# Patient Record
Sex: Male | Born: 1935 | Race: White | Hispanic: No | Marital: Married | Smoking: Former smoker
Health system: Southern US, Community
[De-identification: ages and names within clinical notes are randomized; demographics above are authoritative.]

## PROBLEM LIST (undated history)

## (undated) DIAGNOSIS — Z8669 Personal history of other diseases of the nervous system and sense organs: Secondary | ICD-10-CM

## (undated) DIAGNOSIS — K5792 Diverticulitis of intestine, part unspecified, without perforation or abscess without bleeding: Secondary | ICD-10-CM

## (undated) DIAGNOSIS — R7303 Prediabetes: Secondary | ICD-10-CM

## (undated) DIAGNOSIS — T8859XA Other complications of anesthesia, initial encounter: Secondary | ICD-10-CM

## (undated) DIAGNOSIS — I10 Essential (primary) hypertension: Secondary | ICD-10-CM

## (undated) DIAGNOSIS — M1990 Unspecified osteoarthritis, unspecified site: Secondary | ICD-10-CM

## (undated) DIAGNOSIS — K649 Unspecified hemorrhoids: Secondary | ICD-10-CM

## (undated) DIAGNOSIS — Z9889 Other specified postprocedural states: Secondary | ICD-10-CM

## (undated) DIAGNOSIS — E785 Hyperlipidemia, unspecified: Secondary | ICD-10-CM

## (undated) DIAGNOSIS — I219 Acute myocardial infarction, unspecified: Secondary | ICD-10-CM

## (undated) DIAGNOSIS — C801 Malignant (primary) neoplasm, unspecified: Secondary | ICD-10-CM

## (undated) DIAGNOSIS — T4145XA Adverse effect of unspecified anesthetic, initial encounter: Secondary | ICD-10-CM

## (undated) DIAGNOSIS — H269 Unspecified cataract: Secondary | ICD-10-CM

## (undated) DIAGNOSIS — R194 Change in bowel habit: Secondary | ICD-10-CM

## (undated) DIAGNOSIS — K219 Gastro-esophageal reflux disease without esophagitis: Secondary | ICD-10-CM

## (undated) DIAGNOSIS — T7840XA Allergy, unspecified, initial encounter: Secondary | ICD-10-CM

## (undated) DIAGNOSIS — K5900 Constipation, unspecified: Secondary | ICD-10-CM

## (undated) HISTORY — DX: Unspecified cataract: H26.9

## (undated) HISTORY — DX: Unspecified hemorrhoids: K64.9

## (undated) HISTORY — DX: Allergy, unspecified, initial encounter: T78.40XA

## (undated) HISTORY — PX: OTHER SURGICAL HISTORY: SHX169

## (undated) HISTORY — DX: Personal history of other diseases of the nervous system and sense organs: Z86.69

## (undated) HISTORY — DX: Constipation, unspecified: K59.00

## (undated) HISTORY — DX: Other specified postprocedural states: Z98.890

## (undated) HISTORY — DX: Acute myocardial infarction, unspecified: I21.9

## (undated) HISTORY — PX: COLONOSCOPY: SHX174

## (undated) HISTORY — DX: Gastro-esophageal reflux disease without esophagitis: K21.9

## (undated) HISTORY — DX: Change in bowel habit: R19.4

## (undated) HISTORY — DX: Essential (primary) hypertension: I10

## (undated) HISTORY — DX: Hyperlipidemia, unspecified: E78.5

---

## 1989-02-09 HISTORY — PX: CORONARY ANGIOPLASTY: SHX604

## 1989-09-09 DIAGNOSIS — I219 Acute myocardial infarction, unspecified: Secondary | ICD-10-CM

## 1989-09-09 HISTORY — DX: Acute myocardial infarction, unspecified: I21.9

## 1995-02-10 HISTORY — PX: ROTATOR CUFF REPAIR: SHX139

## 2013-02-09 HISTORY — PX: EYE SURGERY: SHX253

## 2013-06-09 HISTORY — PX: CATARACT EXTRACTION: SUR2

## 2015-06-10 HISTORY — PX: OTHER SURGICAL HISTORY: SHX169

## 2015-06-10 HISTORY — PX: RETINAL DETACHMENT SURGERY: SHX105

## 2015-10-25 ENCOUNTER — Telehealth: Payer: Self-pay | Admitting: Internal Medicine

## 2015-10-31 ENCOUNTER — Encounter: Payer: Self-pay | Admitting: Internal Medicine

## 2015-10-31 NOTE — Telephone Encounter (Signed)
Dr.Pyrtle has reviewed records and has noted patient is due for colonoscopy 11/2015.

## 2015-12-03 ENCOUNTER — Ambulatory Visit (AMBULATORY_SURGERY_CENTER): Payer: Self-pay

## 2015-12-03 VITALS — Ht 67.0 in | Wt 158.4 lb

## 2015-12-03 DIAGNOSIS — Z86010 Personal history of colonic polyps: Secondary | ICD-10-CM

## 2015-12-03 MED ORDER — NA SULFATE-K SULFATE-MG SULF 17.5-3.13-1.6 GM/177ML PO SOLN: ORAL | 0 refills | Status: DC

## 2015-12-03 NOTE — Progress Notes (Signed)
Per pt, no allergies to soy or egg products.Pt not taking any weight loss meds or using  O2 at home. 

## 2015-12-09 ENCOUNTER — Encounter: Payer: Self-pay | Admitting: Internal Medicine

## 2015-12-16 DIAGNOSIS — C189 Malignant neoplasm of colon, unspecified: Secondary | ICD-10-CM

## 2015-12-16 DIAGNOSIS — C801 Malignant (primary) neoplasm, unspecified: Secondary | ICD-10-CM

## 2015-12-16 HISTORY — DX: Malignant neoplasm of colon, unspecified: C18.9

## 2015-12-16 HISTORY — DX: Malignant (primary) neoplasm, unspecified: C80.1

## 2015-12-17 ENCOUNTER — Other Ambulatory Visit (INDEPENDENT_AMBULATORY_CARE_PROVIDER_SITE_OTHER): Payer: Federal, State, Local not specified - PPO

## 2015-12-17 ENCOUNTER — Ambulatory Visit (AMBULATORY_SURGERY_CENTER): Payer: Federal, State, Local not specified - PPO | Admitting: Internal Medicine

## 2015-12-17 ENCOUNTER — Telehealth: Payer: Self-pay

## 2015-12-17 ENCOUNTER — Other Ambulatory Visit: Payer: Self-pay

## 2015-12-17 ENCOUNTER — Encounter: Payer: Self-pay | Admitting: Internal Medicine

## 2015-12-17 VITALS — BP 124/65 | HR 79 | Temp 98.9°F | Resp 16 | Ht 67.0 in | Wt 158.0 lb

## 2015-12-17 DIAGNOSIS — D124 Benign neoplasm of descending colon: Secondary | ICD-10-CM | POA: Diagnosis not present

## 2015-12-17 DIAGNOSIS — K639 Disease of intestine, unspecified: Secondary | ICD-10-CM

## 2015-12-17 DIAGNOSIS — D123 Benign neoplasm of transverse colon: Secondary | ICD-10-CM

## 2015-12-17 DIAGNOSIS — C184 Malignant neoplasm of transverse colon: Secondary | ICD-10-CM | POA: Diagnosis not present

## 2015-12-17 DIAGNOSIS — K6389 Other specified diseases of intestine: Secondary | ICD-10-CM

## 2015-12-17 DIAGNOSIS — Z86010 Personal history of colonic polyps: Secondary | ICD-10-CM

## 2015-12-17 LAB — CBC WITH DIFFERENTIAL/PLATELET
BASOS ABS: 0 10*3/uL (ref 0.0–0.1)
Basophils Relative: 0.4 % (ref 0.0–3.0)
Eosinophils Absolute: 0.3 10*3/uL (ref 0.0–0.7)
Eosinophils Relative: 2.8 % (ref 0.0–5.0)
HEMATOCRIT: 34.1 % — AB (ref 39.0–52.0)
Hemoglobin: 11.1 g/dL — ABNORMAL LOW (ref 13.0–17.0)
LYMPHS PCT: 25.1 % (ref 12.0–46.0)
Lymphs Abs: 2.6 10*3/uL (ref 0.7–4.0)
MCHC: 32.5 g/dL (ref 30.0–36.0)
MCV: 75.6 fl — AB (ref 78.0–100.0)
MONOS PCT: 7.3 % (ref 3.0–12.0)
Monocytes Absolute: 0.8 10*3/uL (ref 0.1–1.0)
NEUTROS PCT: 64.4 % (ref 43.0–77.0)
Neutro Abs: 6.8 10*3/uL (ref 1.4–7.7)
Platelets: 371 10*3/uL (ref 150.0–400.0)
RBC: 4.51 Mil/uL (ref 4.22–5.81)
RDW: 17.6 % — ABNORMAL HIGH (ref 11.5–15.5)
WBC: 10.5 10*3/uL (ref 4.0–10.5)

## 2015-12-17 LAB — COMPREHENSIVE METABOLIC PANEL
ALK PHOS: 105 U/L (ref 39–117)
ALT: 10 U/L (ref 0–53)
AST: 14 U/L (ref 0–37)
Albumin: 4.1 g/dL (ref 3.5–5.2)
BILIRUBIN TOTAL: 0.5 mg/dL (ref 0.2–1.2)
BUN: 14 mg/dL (ref 6–23)
CALCIUM: 9.2 mg/dL (ref 8.4–10.5)
CO2: 25 mEq/L (ref 19–32)
CREATININE: 0.88 mg/dL (ref 0.40–1.50)
Chloride: 105 mEq/L (ref 96–112)
GFR: 88.85 mL/min (ref >60.00)
GLUCOSE: 95 mg/dL (ref 70–99)
Potassium: 3.8 mEq/L (ref 3.5–5.1)
Sodium: 138 mEq/L (ref 135–145)
TOTAL PROTEIN: 7.1 g/dL (ref 6.0–8.3)

## 2015-12-17 MED ORDER — SODIUM CHLORIDE 0.9 % IV SOLN: 500.0000 mL | INTRAVENOUS | Status: DC

## 2015-12-17 NOTE — Telephone Encounter (Signed)
Pt scheduled for CT of CAP at [facility] CT 12/20/15@4pm , pt to be NPO after 12noon, drink bottle 1 of contrast at 2pm, bottle 2 of contrast at 3pm. Pt to have labs done before leaving today. Kieth Brightly, [place] RN, to notify pt of appt and instructions.

## 2015-12-17 NOTE — Op Note (Signed)
[place] Patient Name: Todd French Procedure Date: 12/17/2015 2:09 PM MRN: [id number] Endoscopist: Jerene Bears , MD Age: 80 Referring MD:  Date of Birth: 05-14-1935 Gender: Male Account #: [id number] Procedure:                Colonoscopy Indications:              Surveillance: Personal history of adenomatous                            polyps on last colonoscopy 5 years ago (Dr.                            [place] Carbo, [place], [place]) Medicines:                Monitored Anesthesia Care Procedure:                Pre-Anesthesia Assessment:                           - Prior to the procedure, a History and Physical                            was performed, and patient medications and                            allergies were reviewed. The patient's tolerance of                            previous anesthesia was also reviewed. The risks                            and benefits of the procedure and the sedation                            options and risks were discussed with the patient.                            All questions were answered, and informed consent                            was obtained. Prior Anticoagulants: The patient has                            taken no previous anticoagulant or antiplatelet                            agents. ASA Grade Assessment: III - A patient with                            severe systemic disease. After reviewing the risks                            and benefits, the patient was deemed in  satisfactory condition to undergo the procedure.                           After obtaining informed consent, the colonoscope                            was passed under direct vision. Throughout the                            procedure, the patient's blood pressure, pulse, and                            oxygen saturations were monitored continuously. The                            Model CF-HQ190L [id number]) scope was  introduced                            through the anus with the intention of advancing to                            the cecum. The scope was advanced to the hepatic                            flexure before the procedure was aborted.                            Medications were given. The colonoscopy was                            somewhat difficult due to a partially obstructing                            mass. The patient tolerated the procedure well. The                            quality of the bowel preparation was good. The                            rectum was photographed. Scope In: 2:22:39 PM Scope Out: [id number] PM Total Procedure Duration: 0 hours 24 minutes 33 seconds  Findings:                 The digital rectal exam was normal.                           A fungating and ulcerated partially obstructing                            large mass was found at the hepatic flexure. This                            area was unable to be traversed with the  colonoscope. Biopsied extensively. Area distal to                            the mass lesion was tattooed with 3 injections of                            4.5 mL of Spot (carbon black).                           A diffuse area of moderate melanosis was found in                            the entire colon.                           Five sessile polyps were found in the descending                            colon (1) and transverse colon (4). The polyps were                            2 to 7 mm in size. These polyps were removed with a                            cold snare. Resection and retrieval were complete.                           Multiple small-mouthed diverticula were found in                            the sigmoid colon.                           The retroflexed view of the distal rectum and anal                            verge was normal and showed no anal or rectal                             abnormalities. Complications:            No immediate complications. Estimated Blood Loss:     Estimated blood loss was minimal. 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 Biopsied.                            Tattooed.                           - Five 2 to 7 mm polyps in the descending colon and  in the transverse colon, removed with a cold snare.                            Resected and retrieved.                           - Diverticulosis in the sigmoid colon.                           - Melanosis in the colon.                           - The distal rectum and anal verge are normal on                            retroflexion view.                           - Ascending colon and cecum not visualized due to                            mass at hepatic flexure. Recommendation:           - Patient has a contact number available for                            emergencies. The signs and symptoms of potential                            delayed complications were discussed with the                            patient. Return to normal activities tomorrow.                            Written discharge instructions were provided to the                            patient.                           - Resume previous diet.                           - Continue present medications.                           - Await pathology results.                           - Perform a CT scan (computed tomography) of chest                            with contrast, abdomen with contrast and pelvis  with contrast at appointment to be scheduled.                           - Repeat colonoscopy is recommended. The                            colonoscopy date will be determined after pathology                            results from today's exam become available for                             review. Jerene Bears, MD 12/17/2015 2:58:39 PM This report has been signed electronically.

## 2015-12-17 NOTE — Progress Notes (Signed)
A/ox3 pleased with MAC, report to Penny RN 

## 2015-12-17 NOTE — Patient Instructions (Signed)
YOU HAD AN ENDOSCOPIC PROCEDURE TODAY AT [facility]:   Refer to the procedure report that was given to you for any specific questions about what was found during the examination.  If the procedure report does not answer your questions, please call your gastroenterologist to clarify.  If you requested that your care partner not be given the details of your procedure findings, then the procedure report has been included in a sealed envelope for you to review at your convenience later.  YOU SHOULD EXPECT: Some feelings of bloating in the abdomen. Passage of more gas than usual.  Walking can help get rid of the air that was put into your GI tract during the procedure and reduce the bloating. If you had a lower endoscopy (such as a colonoscopy or flexible sigmoidoscopy) you may notice spotting of blood in your stool or on the toilet paper. If you underwent a bowel prep for your procedure, you may not have a normal bowel movement for a few days.  Please Note:  You might notice some irritation and congestion in your nose or some drainage.  This is from the oxygen used during your procedure.  There is no need for concern and it should clear up in a day or so.  SYMPTOMS TO REPORT IMMEDIATELY:   Following lower endoscopy (colonoscopy or flexible sigmoidoscopy):  Excessive amounts of blood in the stool  Significant tenderness or worsening of abdominal pains  Swelling of the abdomen that is new, acute  Fever of 100F or higher    For urgent or emergent issues, a gastroenterologist can be reached at any hour by calling [phone].   DIET:  We do recommend a small meal at first, but then you may proceed to your regular diet.  Drink plenty of fluids but you should avoid alcoholic beverages for 24 hours.  ACTIVITY:  You should plan to take it easy for the rest of today and you should NOT DRIVE or use heavy machinery until tomorrow (because of the sedation medicines used during the test).     FOLLOW UP: Our staff will call the number listed on your records the next business day following your procedure to check on you and address any questions or concerns that you may have regarding the information given to you following your procedure. If we do not reach you, we will leave a message.  However, if you are feeling well and you are not experiencing any problems, there is no need to return our call.  We will assume that you have returned to your regular daily activities without incident.  If any biopsies were taken you will be contacted by phone or by letter within the next 1-3 weeks.  Please call us at [phone] if you have not heard about the biopsies in 3 weeks.    SIGNATURES/CONFIDENTIALITY: You and/or your care partner have signed paperwork which will be entered into your electronic medical record.  These signatures attest to the fact that that the information above on your After Visit Summary has been reviewed and is understood.  Full responsibility of the confidentiality of this discharge information lies with you and/or your care-partner.  Information on polyps,diverticulosis  given to you today  Pt to go to Lab on discharge today  Oral contrast given to you today ,await call from Dr Vena Rua office to give you time and date of CT scan. A form was given to you today to fill out time and date and CT  instructions when Dr Vena Rua nurse calls you

## 2015-12-17 NOTE — Progress Notes (Signed)
Called to room to assist during endoscopic procedure.  Patient ID and intended procedure confirmed with present staff. Received instructions for my participation in the procedure from the performing physician.  

## 2015-12-18 ENCOUNTER — Telehealth: Payer: Self-pay

## 2015-12-18 NOTE — Telephone Encounter (Signed)
  Follow up Call-  Call back number 12/17/2015  Post procedure Call Back phone  # [phone]  Permission to leave phone message Yes     Patient questions:  Do you have a fever, pain , or abdominal swelling? No. Pain Score  0 *  Have you tolerated food without any problems? Yes.    Have you been able to return to your normal activities? Yes.    Do you have any questions about your discharge instructions: Diet   No. Medications  No. Follow up visit  No.  Do you have questions or concerns about your Care? No.  Actions: * If pain score is 4 or above: No action needed, pain <4.

## 2015-12-19 ENCOUNTER — Other Ambulatory Visit: Payer: Self-pay

## 2015-12-19 DIAGNOSIS — C189 Malignant neoplasm of colon, unspecified: Secondary | ICD-10-CM

## 2015-12-20 ENCOUNTER — Ambulatory Visit (INDEPENDENT_AMBULATORY_CARE_PROVIDER_SITE_OTHER)
Admission: RE | Admit: 2015-12-20 | Discharge: 2015-12-20 | Disposition: A | Discharge status: Home / Self Care | Payer: Federal, State, Local not specified - PPO | Source: Ambulatory Visit | Attending: Internal Medicine | Admitting: Internal Medicine

## 2015-12-20 DIAGNOSIS — K639 Disease of intestine, unspecified: Secondary | ICD-10-CM

## 2015-12-20 DIAGNOSIS — K6389 Other specified diseases of intestine: Secondary | ICD-10-CM

## 2015-12-20 MED ORDER — IOPAMIDOL (ISOVUE-300) INJECTION 61%
100.0000 mL | Freq: Once | INTRAVENOUS | Status: AC | PRN
  Administered 2015-12-20: 100 mL via INTRAVENOUS

## 2015-12-23 ENCOUNTER — Telehealth: Payer: Self-pay | Admitting: *Deleted

## 2015-12-23 NOTE — Telephone Encounter (Signed)
Received referral from Dr. Hilarie Fredrickson on 12/19/15 for Colon Cancer. Made patient aware that since his CT scan looks good, most likely surgery will be first intervention. Medical oncology will see him 2 weeks post op. Informed him that navigator will be following his chart to schedule the appointment. Provided contact # of navigator for him to call with any questions. Also made him aware that he could be seen at [facility] in [place] since it is closer to his home. He says he will think about this option as well.

## 2015-12-24 ENCOUNTER — Other Ambulatory Visit: Payer: Self-pay | Admitting: General Surgery

## 2015-12-24 NOTE — H&P (Signed)
History of Present Illness Todd Ruff MD; Q000111Q 9:54 AM) The patient is a 80 year old male who presents with colorectal cancer. 80 year old male who presents to the office for evaluation of a newly diagnosed colon cancer. He underwent surveillance colonoscopy with Dr. Hilarie Fredrickson. This showed several polyps throughout his transverse and descending colon and a hepatic flexure mass that was unable to be traversed. Biopsies were positive for adenocarcinoma. CT scans of chest abdomen and pelvis show no signs of metastatic disease. Patient states she has had some intermittent constipation and abdominal pain recently. He denies any blood in his stool or black tarry stools. He denies any weight loss.   Other Problems Todd Lorenzo, LPN; 075-GRM 075-GRM AM) Back Pain Colon Cancer Diverticulosis Gastroesophageal Reflux Disease Hemorrhoids High blood pressure Hypercholesterolemia Myocardial infarction  Past Surgical History Todd Lorenzo, LPN; 075-GRM 075-GRM AM) Cataract Surgery Bilateral. Colon Polyp Removal - Colonoscopy Shoulder Surgery Bilateral.  Diagnostic Studies History Todd Lorenzo, LPN; 075-GRM 075-GRM AM) Colonoscopy within last year  Allergies Todd Lorenzo, LPN; 075-GRM [id number] AM) No Known Drug Allergies 12/24/2015  Medication History Todd Lorenzo, LPN; 075-GRM 075-GRM AM) Vaseretic (10-25MG  Tablet, Oral) Active. Lipitor (10MG  Tablet, Oral) Active. Aspirin (81MG  Tablet DR, Oral) Active. Claritin-D 24 Hour (10-240MG  Tablet ER 24HR, Oral) Active. Zantac (150MG  Tablet, Oral) Active. Ciloxan (0.3% Solution, Ophthalmic) Active. Colon Cleanse (Oral) Active. Medications Reconciled  Social History Todd Lorenzo, LPN; 075-GRM 075-GRM AM) Alcohol use Occasional alcohol use. Caffeine use Carbonated beverages, Coffee, Tea. No drug use Tobacco use Former smoker.  Family History Todd Lorenzo, LPN; 075-GRM 075-GRM AM) Arthritis  Mother. Cerebrovascular Accident Father. Heart Disease Father.     Review of Systems Todd Billings Dockery LPN; 075-GRM 075-GRM AM) General Not Present- Appetite Loss, Chills, Fatigue, Fever, Night Sweats, Weight Gain and Weight Loss. Skin Present- Dryness. Not Present- Change in Wart/Mole, Hives, Jaundice, New Lesions, Non-Healing Wounds, Rash and Ulcer. HEENT Present- Hearing Loss, Seasonal Allergies and Wears glasses/contact lenses. Not Present- Earache, Hoarseness, Nose Bleed, Oral Ulcers, Ringing in the Ears, Sinus Pain, Sore Throat, Visual Disturbances and Yellow Eyes. Respiratory Present- Snoring. Not Present- Bloody sputum, Chronic Cough, Difficulty Breathing and Wheezing. Breast Not Present- Breast Mass, Breast Pain, Nipple Discharge and Skin Changes. Cardiovascular Present- Leg Cramps and Shortness of Breath. Not Present- Chest Pain, Difficulty Breathing Lying Down, Palpitations, Rapid Heart Rate and Swelling of Extremities. Gastrointestinal Present- Constipation. Not Present- Abdominal Pain, Bloating, Bloody Stool, Change in Bowel Habits, Chronic diarrhea, Difficulty Swallowing, Excessive gas, Gets full quickly at meals, Hemorrhoids, Indigestion, Nausea, Rectal Pain and Vomiting. Male Genitourinary Present- Frequency and Urgency. Not Present- Blood in Urine, Change in Urinary Stream, Impotence, Nocturia, Painful Urination and Urine Leakage. Musculoskeletal Not Present- Back Pain, Joint Pain, Joint Stiffness, Muscle Pain, Muscle Weakness and Swelling of Extremities. Neurological Not Present- Decreased Memory, Fainting, Headaches, Numbness, Seizures, Tingling, Tremor, Trouble walking and Weakness. Psychiatric Not Present- Anxiety, Bipolar, Change in Sleep Pattern, Depression, Fearful and Frequent crying. Endocrine Not Present- Cold Intolerance, Excessive Hunger, Hair Changes, Heat Intolerance, Hot flashes and New Diabetes. Hematology Present- Easy Bruising. Not Present- Blood Thinners,  Excessive bleeding, Gland problems, HIV and Persistent Infections.  Vitals Todd Billings Dockery LPN; 075-GRM [id number] AM) 12/24/2015 9:36 AM Weight: 155.2 lb Height: 67in Body Surface Area: 1.82 m Body Mass Index: 24.31 kg/m  Temp.: 97.33F(Oral)  Pulse: 86 (Regular)  BP: 124/72 (Sitting, Left Arm, Standard)      Physical Exam Todd Ruff MD; Q000111Q 9:55 AM)  General Mental Status-Alert. General Appearance-Not in acute distress.  Build & Nutrition-Well nourished. Posture-Normal posture. Gait-Normal.  Head and Neck Head-normocephalic, atraumatic with no lesions or palpable masses. Trachea-midline.  Chest and Lung Exam Chest and lung exam reveals -on auscultation, normal breath sounds, no adventitious sounds and normal vocal resonance.  Cardiovascular Cardiovascular examination reveals -normal heart sounds, regular rate and rhythm with no murmurs and no digital clubbing, cyanosis, edema, increased warmth or tenderness.  Abdomen Inspection Inspection of the abdomen reveals - No Hernias. Palpation/Percussion Palpation and Percussion of the abdomen reveal - Soft, Non Tender, No Rigidity (guarding), No hepatosplenomegaly and No Palpable abdominal masses.  Neurologic Neurologic evaluation reveals -alert and oriented x 3 with no impairment of recent or remote memory, normal attention span and ability to concentrate, normal sensation and normal coordination.  Musculoskeletal Normal Exam - Bilateral-Upper Extremity Strength Normal and Lower Extremity Strength Normal.    Assessment & Plan Todd Ruff MD; Q000111Q 9:58 AM)  COLON CANCER, ASCENDING (C18.2) Impression: 80 year old male who presents to the office for evaluation of a newly diagnosed colon cancer. This was noted to be in the hepatic flexure on CT scan and colonoscopy. It was tattooed. CT scans show no signs of metastatic disease. I have recommended a left Right colectomy. I  think he should tolerate this well. We will touch base with this cardiology office just to make sure there is no further testing needs to be done prior to anesthesia. We will get a CEA level with his preoperative lab work. The surgery and anatomy were described to the patient as well as the risks of surgery and the possible complications. These include: Bleeding, deep abdominal infections and possible wound complications such as hernia and infection, damage to adjacent structures, leak of surgical connections, which can lead to other surgeries and possibly an ostomy, possible need for other procedures, such as abscess drains in radiology, possible prolonged hospital stay, possible diarrhea from removal of part of the colon, possible constipation from narcotics, possible bowel, bladder or sexual dysfunction if having rectal surgery, prolonged fatigue/weakness or appetite loss, possible early recurrence of of disease, possible complications of their medical problems such as heart disease or arrhythmias or lung problems, death (less than 1%). I believe the patient understands and wishes to proceed with the surgery.

## 2015-12-30 ENCOUNTER — Telehealth: Payer: Self-pay | Admitting: *Deleted

## 2015-12-30 NOTE — Telephone Encounter (Signed)
Oncology Nurse Navigator Documentation  Oncology Nurse Navigator Flowsheets 12/30/2015  Navigator Location [facility]  Referral date to RadOnc/MedOnc 12/19/2015  Navigator Encounter Type Introductory phone call  Abnormal Finding Date 12/17/2015  Confirmed Diagnosis Date 12/17/2015  Spoke with patient and provided new patient appointment for 02/04/16 at 2 pm with Dr. Benay Spice. Informed of location of [place], valet service, and registration process. Reminded to bring insurance cards and a current medication list, including supplements. Patient verbalizes understanding. Message to HIM to enter appointment into EPIC. Message to Dr. Hilarie Fredrickson with appointment date.

## 2016-01-07 NOTE — Patient Instructions (Addendum)
Sriyansh Nordmeyer  01/07/2016   Your procedure is scheduled on: 01-14-16  Report to [facility] Main  Entrance take [facility]  elevators to 3rd floor to  [place] at 530 AM.  Call this number if you have problems the morning of surgery [phone]   Remember: ONLY 1 PERSON MAY GO WITH YOU TO SHORT STAY TO GET  READY MORNING OF [place].  Do not eat food  :After Midnight Sunday NIGHT CLEAR LIQUIDS ALL DAY Monday PER DR THOMAS, NO CLEAR LIQUIDS AFTER MIDNIGHT Monday NIGHT, FOLLOW ALL BOWEL,PREP INSTRUCTIONS FROM DR Marcello Moores.      Take these medicines the morning of surgery with A SIP OF WATER: RESTASISI EYE DROP, ATORVASTAIN (LIPITOR), CLARITIN D, PREDNISOLONE EYE DROP                               You may not have any metal on your body including hair pins and              piercings  Do not wear jewelry, make-up, lotions, powders or perfumes, deodorant             Do not wear nail polish.  Do not shave  48 hours prior to surgery.              Men may shave face and neck.   Do not bring valuables to the hospital. [place].  Contacts, dentures or bridgework may not be worn into surgery.  Leave suitcase in the car. After surgery it may be brought to your room.                 Please read over the following fact sheets you were given: _____________________________________________________________________             [facility] - Preparing for Surgery Before surgery, you can play an important role.  Because skin is not sterile, your skin needs to be as free of germs as possible.  You can reduce the number of germs on your skin by washing with CHG (chlorahexidine gluconate) soap before surgery.  CHG is an antiseptic cleaner which kills germs and bonds with the skin to continue killing germs even after washing. Please DO NOT use if you have an allergy to CHG or antibacterial soaps.  If your skin becomes  reddened/irritated stop using the CHG and inform your nurse when you arrive at Short Stay. Do not shave (including legs and underarms) for at least 48 hours prior to the first CHG shower.  You may shave your face/neck. Please follow these instructions carefully:  1.  Shower with CHG Soap the night before surgery and the  morning of Surgery.  2.  If you choose to wash your hair, wash your hair first as usual with your  normal  shampoo.  3.  After you shampoo, rinse your hair and body thoroughly to remove the  shampoo.                           4.  Use CHG as you would any other liquid soap.  You can apply chg directly  to the skin and wash  Gently with a scrungie or clean washcloth.  5.  Apply the CHG Soap to your body ONLY FROM THE NECK DOWN.   Do not use on face/ open                           Wound or open sores. Avoid contact with eyes, ears mouth and genitals (private parts).                       Wash face,  Genitals (private parts) with your normal soap.             6.  Wash thoroughly, paying special attention to the area where your surgery  will be performed.  7.  Thoroughly rinse your body with warm water from the neck down.  8.  DO NOT shower/wash with your normal soap after using and rinsing off  the CHG Soap.                9.  Pat yourself dry with a clean towel.            10.  Wear clean pajamas.            11.  Place clean sheets on your bed the night of your first shower and do not  sleep with pets. Day of Surgery : Do not apply any lotions/deodorants the morning of surgery.  Please wear clean clothes to the hospital/surgery center.  FAILURE TO FOLLOW THESE INSTRUCTIONS MAY RESULT IN THE CANCELLATION OF YOUR SURGERY PATIENT SIGNATURE_________________________________  NURSE SIGNATURE__________________________________  ________________________________________________________________________

## 2016-01-07 NOTE — Progress Notes (Signed)
LOV DR ZACKARY 07-16-15 ON CHART  EKG 10-16-15 DR Cyndie Mull ON CHART

## 2016-01-08 ENCOUNTER — Encounter (HOSPITAL_COMMUNITY): Payer: Self-pay

## 2016-01-08 ENCOUNTER — Encounter (HOSPITAL_COMMUNITY)
Admission: RE | Admit: 2016-01-08 | Discharge: 2016-01-08 | Disposition: A | Discharge status: Home / Self Care | Payer: Federal, State, Local not specified - PPO | Source: Ambulatory Visit | Attending: General Surgery | Admitting: General Surgery

## 2016-01-08 DIAGNOSIS — Z79899 Other long term (current) drug therapy: Secondary | ICD-10-CM | POA: Diagnosis not present

## 2016-01-08 DIAGNOSIS — C182 Malignant neoplasm of ascending colon: Secondary | ICD-10-CM | POA: Insufficient documentation

## 2016-01-08 DIAGNOSIS — Z01818 Encounter for other preprocedural examination: Secondary | ICD-10-CM | POA: Insufficient documentation

## 2016-01-08 DIAGNOSIS — K219 Gastro-esophageal reflux disease without esophagitis: Secondary | ICD-10-CM | POA: Insufficient documentation

## 2016-01-08 DIAGNOSIS — Z7982 Long term (current) use of aspirin: Secondary | ICD-10-CM | POA: Insufficient documentation

## 2016-01-08 DIAGNOSIS — Z01812 Encounter for preprocedural laboratory examination: Secondary | ICD-10-CM | POA: Diagnosis not present

## 2016-01-08 DIAGNOSIS — I252 Old myocardial infarction: Secondary | ICD-10-CM | POA: Diagnosis not present

## 2016-01-08 DIAGNOSIS — E7800 Pure hypercholesterolemia, unspecified: Secondary | ICD-10-CM | POA: Diagnosis not present

## 2016-01-08 DIAGNOSIS — R030 Elevated blood-pressure reading, without diagnosis of hypertension: Secondary | ICD-10-CM | POA: Insufficient documentation

## 2016-01-08 DIAGNOSIS — Z87891 Personal history of nicotine dependence: Secondary | ICD-10-CM | POA: Diagnosis not present

## 2016-01-08 HISTORY — DX: Prediabetes: R73.03

## 2016-01-08 HISTORY — DX: Unspecified osteoarthritis, unspecified site: M19.90

## 2016-01-08 HISTORY — DX: Other complications of anesthesia, initial encounter: T88.59XA

## 2016-01-08 HISTORY — DX: Malignant (primary) neoplasm, unspecified: C80.1

## 2016-01-08 HISTORY — DX: Adverse effect of unspecified anesthetic, initial encounter: T41.45XA

## 2016-01-08 HISTORY — DX: Diverticulitis of intestine, part unspecified, without perforation or abscess without bleeding: K57.92

## 2016-01-08 LAB — BASIC METABOLIC PANEL
Anion gap: 6 (ref 5–15)
BUN: 18 mg/dL (ref 6–20)
CHLORIDE: 104 mmol/L (ref 101–111)
CO2: 26 mmol/L (ref 22–32)
Calcium: 9.2 mg/dL (ref 8.9–10.3)
Creatinine, Ser: 0.93 mg/dL (ref 0.61–1.24)
GFR calc Af Amer: >60 mL/min (ref >60)
GFR calc non Af Amer: >60 mL/min (ref >60)
GLUCOSE: 119 mg/dL — AB (ref 65–99)
POTASSIUM: 4.5 mmol/L (ref 3.5–5.1)
Sodium: 136 mmol/L (ref 135–145)

## 2016-01-08 LAB — CBC
HEMATOCRIT: 32.7 % — AB (ref 39.0–52.0)
Hemoglobin: 10.2 g/dL — ABNORMAL LOW (ref 13.0–17.0)
MCH: 24 pg — ABNORMAL LOW (ref 26.0–34.0)
MCHC: 31.2 g/dL (ref 30.0–36.0)
MCV: 76.9 fL — AB (ref 78.0–100.0)
Platelets: 431 10*3/uL — ABNORMAL HIGH (ref 150–400)
RBC: 4.25 MIL/uL (ref 4.22–5.81)
RDW: 16.6 % — AB (ref 11.5–15.5)
WBC: 13.5 10*3/uL — ABNORMAL HIGH (ref 4.0–10.5)

## 2016-01-08 LAB — ABO/RH: ABO/RH(D): A POS

## 2016-01-08 NOTE — Progress Notes (Signed)
   01/08/16 0910  OBSTRUCTIVE SLEEP APNEA  Have you ever been diagnosed with sleep apnea through a sleep study? No  Do you snore loudly (loud enough to be heard through closed doors)?  1  Do you often feel tired, fatigued, or sleepy during the daytime (such as falling asleep during driving or talking to someone)? 1  Has anyone observed you stop breathing during your sleep? 0  Do you have, or are you being treated for high blood pressure? 1  BMI more than 35 kg/m2? 0  Age > 50 (1-yes) 1  Neck circumference greater than:Male 16 inches or larger, Male 17inches or larger? 0  Male Gender (Yes=1) 1  Obstructive Sleep Apnea Score 5

## 2016-01-09 LAB — HEMOGLOBIN A1C
Hgb A1c MFr Bld: 5.9 % — ABNORMAL HIGH (ref 4.8–5.6)
Mean Plasma Glucose: 123 mg/dL

## 2016-01-09 LAB — CEA: CEA: 2.5 ng/mL (ref 0.0–4.7)

## 2016-01-14 ENCOUNTER — Encounter (HOSPITAL_COMMUNITY): Payer: Self-pay | Admitting: *Deleted

## 2016-01-14 ENCOUNTER — Inpatient Hospital Stay (HOSPITAL_COMMUNITY): Payer: Medicare Other | Admitting: Registered Nurse

## 2016-01-14 ENCOUNTER — Encounter (HOSPITAL_COMMUNITY)
Admission: RE | Disposition: A | Discharge status: Home / Self Care | Payer: Self-pay | Source: Ambulatory Visit | Attending: General Surgery

## 2016-01-14 ENCOUNTER — Inpatient Hospital Stay (HOSPITAL_COMMUNITY)
Admission: RE | Admit: 2016-01-14 | Discharge: 2016-01-20 | DRG: 330 | Disposition: A | Discharge status: Home / Self Care | Payer: Medicare Other | Source: Ambulatory Visit | Attending: General Surgery | Admitting: General Surgery

## 2016-01-14 DIAGNOSIS — Y92239 Unspecified place in hospital as the place of occurrence of the external cause: Secondary | ICD-10-CM | POA: Diagnosis not present

## 2016-01-14 DIAGNOSIS — K913 Postprocedural intestinal obstruction, unspecified as to partial versus complete: Secondary | ICD-10-CM | POA: Diagnosis not present

## 2016-01-14 DIAGNOSIS — I10 Essential (primary) hypertension: Secondary | ICD-10-CM | POA: Diagnosis present

## 2016-01-14 DIAGNOSIS — D500 Iron deficiency anemia secondary to blood loss (chronic): Secondary | ICD-10-CM | POA: Diagnosis present

## 2016-01-14 DIAGNOSIS — Y838 Other surgical procedures as the cause of abnormal reaction of the patient, or of later complication, without mention of misadventure at the time of the procedure: Secondary | ICD-10-CM | POA: Diagnosis not present

## 2016-01-14 DIAGNOSIS — C189 Malignant neoplasm of colon, unspecified: Secondary | ICD-10-CM | POA: Diagnosis present

## 2016-01-14 DIAGNOSIS — R42 Dizziness and giddiness: Secondary | ICD-10-CM | POA: Diagnosis present

## 2016-01-14 DIAGNOSIS — D62 Acute posthemorrhagic anemia: Secondary | ICD-10-CM | POA: Diagnosis not present

## 2016-01-14 HISTORY — PX: LAPAROSCOPIC PARTIAL COLECTOMY: SHX5907

## 2016-01-14 LAB — GLUCOSE, CAPILLARY: Glucose-Capillary: 145 mg/dL — ABNORMAL HIGH (ref 65–99)

## 2016-01-14 SURGERY — LAPAROSCOPIC PARTIAL COLECTOMY
Anesthesia: General

## 2016-01-14 MED ORDER — DEXAMETHASONE SODIUM PHOSPHATE 10 MG/ML IJ SOLN
INTRAMUSCULAR | Status: DC | PRN
  Administered 2016-01-14: 10 mg via INTRAVENOUS

## 2016-01-14 MED ORDER — ENALAPRIL-HYDROCHLOROTHIAZIDE 10-25 MG PO TABS: 1.0000 | ORAL_TABLET | Freq: Every day | ORAL | Status: DC

## 2016-01-14 MED ORDER — LORATADINE 10 MG PO TABS: 10.0000 mg | ORAL_TABLET | Freq: Every day | ORAL | Status: DC

## 2016-01-14 MED ORDER — PREDNISOLONE ACETATE 1 % OP SUSP
1.0000 [drp] | Freq: Three times a day (TID) | OPHTHALMIC | Status: DC
  Administered 2016-01-14 – 2016-01-19 (×16): 1 [drp] via OPHTHALMIC
  Filled 2016-01-14: qty 1

## 2016-01-14 MED ORDER — SUCCINYLCHOLINE CHLORIDE 200 MG/10ML IV SOSY
PREFILLED_SYRINGE | INTRAVENOUS | Status: DC | PRN
  Administered 2016-01-14: 120 mg via INTRAVENOUS

## 2016-01-14 MED ORDER — ORAL CARE MOUTH RINSE
15.0000 mL | Freq: Two times a day (BID) | OROMUCOSAL | Status: DC
  Administered 2016-01-15 – 2016-01-17 (×6): 15 mL via OROMUCOSAL

## 2016-01-14 MED ORDER — MORPHINE SULFATE (PF) 2 MG/ML IV SOLN
2.0000 mg | INTRAVENOUS | Status: DC | PRN
  Administered 2016-01-14 – 2016-01-16 (×4): 2 mg via INTRAVENOUS
  Filled 2016-01-14 (×4): qty 1

## 2016-01-14 MED ORDER — LACTATED RINGERS IV SOLN
INTRAVENOUS | Status: DC | PRN
Start: 2016-01-14 — End: 2016-01-14
  Administered 2016-01-14 (×3): via INTRAVENOUS

## 2016-01-14 MED ORDER — EPHEDRINE SULFATE-NACL 50-0.9 MG/10ML-% IV SOSY
PREFILLED_SYRINGE | INTRAVENOUS | Status: DC | PRN
  Administered 2016-01-14: 10 mg via INTRAVENOUS

## 2016-01-14 MED ORDER — FENTANYL CITRATE (PF) 100 MCG/2ML IJ SOLN
INTRAMUSCULAR | Status: AC
  Filled 2016-01-14: qty 2

## 2016-01-14 MED ORDER — LIDOCAINE 2% (20 MG/ML) 5 ML SYRINGE
INTRAMUSCULAR | Status: DC | PRN
  Administered 2016-01-14: 100 mg via INTRAVENOUS

## 2016-01-14 MED ORDER — HYDROMORPHONE HCL 1 MG/ML IJ SOLN: 0.2500 mg | INTRAMUSCULAR | Status: DC | PRN

## 2016-01-14 MED ORDER — SUGAMMADEX SODIUM 200 MG/2ML IV SOLN
INTRAVENOUS | Status: DC | PRN
  Administered 2016-01-14: 200 mg via INTRAVENOUS

## 2016-01-14 MED ORDER — ALVIMOPAN 12 MG PO CAPS
12.0000 mg | ORAL_CAPSULE | Freq: Once | ORAL | Status: AC
  Administered 2016-01-14: 12 mg via ORAL
  Filled 2016-01-14: qty 1

## 2016-01-14 MED ORDER — ROCURONIUM BROMIDE 10 MG/ML (PF) SYRINGE
PREFILLED_SYRINGE | INTRAVENOUS | Status: DC | PRN
  Administered 2016-01-14: 10 mg via INTRAVENOUS
  Administered 2016-01-14: 40 mg via INTRAVENOUS
  Administered 2016-01-14: 10 mg via INTRAVENOUS

## 2016-01-14 MED ORDER — ONDANSETRON HCL 4 MG PO TABS: 4.0000 mg | ORAL_TABLET | Freq: Four times a day (QID) | ORAL | Status: DC | PRN

## 2016-01-14 MED ORDER — LACTATED RINGERS IV SOLN: INTRAVENOUS | Status: DC

## 2016-01-14 MED ORDER — ENALAPRIL MALEATE 10 MG PO TABS
10.0000 mg | ORAL_TABLET | Freq: Every day | ORAL | Status: DC
  Administered 2016-01-15 – 2016-01-20 (×6): 10 mg via ORAL
  Filled 2016-01-14 (×6): qty 1

## 2016-01-14 MED ORDER — PSEUDOEPHEDRINE HCL ER 240 MG PO TB24
240.0000 mg | ORAL_TABLET | Freq: Every morning | ORAL | Status: DC
  Filled 2016-01-14: qty 1

## 2016-01-14 MED ORDER — ACETAMINOPHEN 500 MG PO TABS
1000.0000 mg | ORAL_TABLET | Freq: Four times a day (QID) | ORAL | Status: AC
  Administered 2016-01-14 – 2016-01-15 (×4): 1000 mg via ORAL
  Filled 2016-01-14 (×5): qty 2

## 2016-01-14 MED ORDER — ONDANSETRON HCL 4 MG/2ML IJ SOLN
INTRAMUSCULAR | Status: AC
  Filled 2016-01-14: qty 2

## 2016-01-14 MED ORDER — GABAPENTIN 300 MG PO CAPS
300.0000 mg | ORAL_CAPSULE | ORAL | Status: AC
  Administered 2016-01-14: 300 mg via ORAL
  Filled 2016-01-14: qty 1

## 2016-01-14 MED ORDER — LORATADINE-PSEUDOEPHEDRINE ER 10-240 MG PO TB24: 1.0000 | ORAL_TABLET | Freq: Every day | ORAL | Status: DC

## 2016-01-14 MED ORDER — PROPOFOL 10 MG/ML IV BOLUS
INTRAVENOUS | Status: DC | PRN
  Administered 2016-01-14: 50 mg via INTRAVENOUS
  Administered 2016-01-14: 150 mg via INTRAVENOUS

## 2016-01-14 MED ORDER — FENTANYL CITRATE (PF) 100 MCG/2ML IJ SOLN
INTRAMUSCULAR | Status: AC
  Filled 2016-01-14: qty 4

## 2016-01-14 MED ORDER — BUPIVACAINE HCL (PF) 0.25 % IJ SOLN
INTRAMUSCULAR | Status: AC
  Filled 2016-01-14: qty 60

## 2016-01-14 MED ORDER — HYDROCHLOROTHIAZIDE 25 MG PO TABS
25.0000 mg | ORAL_TABLET | Freq: Every day | ORAL | Status: DC
  Administered 2016-01-15 – 2016-01-20 (×6): 25 mg via ORAL
  Filled 2016-01-14 (×6): qty 1

## 2016-01-14 MED ORDER — ROCURONIUM BROMIDE 50 MG/5ML IV SOSY
PREFILLED_SYRINGE | INTRAVENOUS | Status: AC
  Filled 2016-01-14: qty 5

## 2016-01-14 MED ORDER — ALBUMIN HUMAN 5 % IV SOLN
INTRAVENOUS | Status: AC
  Filled 2016-01-14: qty 250

## 2016-01-14 MED ORDER — ONDANSETRON HCL 4 MG/2ML IJ SOLN
INTRAMUSCULAR | Status: DC | PRN
  Administered 2016-01-14: 4 mg via INTRAVENOUS

## 2016-01-14 MED ORDER — PROPOFOL 10 MG/ML IV BOLUS
INTRAVENOUS | Status: AC
  Filled 2016-01-14: qty 20

## 2016-01-14 MED ORDER — FENTANYL CITRATE (PF) 100 MCG/2ML IJ SOLN
INTRAMUSCULAR | Status: DC | PRN
  Administered 2016-01-14: 100 ug via INTRAVENOUS
  Administered 2016-01-14: 50 ug via INTRAVENOUS
  Administered 2016-01-14: 25 ug via INTRAVENOUS
  Administered 2016-01-14 (×2): 50 ug via INTRAVENOUS
  Administered 2016-01-14: 25 ug via INTRAVENOUS

## 2016-01-14 MED ORDER — DEXTROSE 5 % IV SOLN
2.0000 g | Freq: Two times a day (BID) | INTRAVENOUS | Status: AC
  Administered 2016-01-14: 2 g via INTRAVENOUS
  Filled 2016-01-14: qty 2

## 2016-01-14 MED ORDER — DEXAMETHASONE SODIUM PHOSPHATE 10 MG/ML IJ SOLN
INTRAMUSCULAR | Status: AC
  Filled 2016-01-14: qty 1

## 2016-01-14 MED ORDER — FAMOTIDINE 20 MG PO TABS
20.0000 mg | ORAL_TABLET | Freq: Every day | ORAL | Status: DC
  Administered 2016-01-14 – 2016-01-16 (×3): 20 mg via ORAL
  Filled 2016-01-14 (×3): qty 1

## 2016-01-14 MED ORDER — BUPIVACAINE HCL (PF) 0.25 % IJ SOLN
INTRAMUSCULAR | Status: DC | PRN
  Administered 2016-01-14: 30 mL

## 2016-01-14 MED ORDER — ALVIMOPAN 12 MG PO CAPS
12.0000 mg | ORAL_CAPSULE | Freq: Two times a day (BID) | ORAL | Status: DC
  Administered 2016-01-15 – 2016-01-17 (×6): 12 mg via ORAL
  Filled 2016-01-14 (×6): qty 1

## 2016-01-14 MED ORDER — ENOXAPARIN SODIUM 40 MG/0.4ML ~~LOC~~ SOLN
40.0000 mg | SUBCUTANEOUS | Status: DC
  Administered 2016-01-16 – 2016-01-20 (×5): 40 mg via SUBCUTANEOUS
  Filled 2016-01-14 (×5): qty 0.4

## 2016-01-14 MED ORDER — KCL IN DEXTROSE-NACL 20-5-0.45 MEQ/L-%-% IV SOLN
INTRAVENOUS | Status: DC
  Administered 2016-01-14 – 2016-01-15 (×2): via INTRAVENOUS
  Administered 2016-01-15: 75 mL/h via INTRAVENOUS
  Administered 2016-01-16: 1000 mL via INTRAVENOUS
  Administered 2016-01-16 – 2016-01-18 (×7): via INTRAVENOUS
  Filled 2016-01-14 (×12): qty 1000

## 2016-01-14 MED ORDER — CYCLOSPORINE 0.05 % OP EMUL
1.0000 [drp] | Freq: Every day | OPHTHALMIC | Status: DC
  Administered 2016-01-14 – 2016-01-19 (×6): 1 [drp] via OPHTHALMIC
  Filled 2016-01-14 (×7): qty 1

## 2016-01-14 MED ORDER — BUPIVACAINE LIPOSOME 1.3 % IJ SUSP
20.0000 mL | INTRAMUSCULAR | Status: DC
  Filled 2016-01-14: qty 20

## 2016-01-14 MED ORDER — LIDOCAINE 2% (20 MG/ML) 5 ML SYRINGE
INTRAMUSCULAR | Status: AC
  Filled 2016-01-14: qty 5

## 2016-01-14 MED ORDER — DEXTROSE 5 % IV SOLN
2.0000 g | INTRAVENOUS | Status: AC
  Administered 2016-01-14: 2 g via INTRAVENOUS

## 2016-01-14 MED ORDER — PHENYLEPHRINE 40 MCG/ML (10ML) SYRINGE FOR IV PUSH (FOR BLOOD PRESSURE SUPPORT)
PREFILLED_SYRINGE | INTRAVENOUS | Status: DC | PRN
  Administered 2016-01-14: 80 ug via INTRAVENOUS
  Administered 2016-01-14: 120 ug via INTRAVENOUS

## 2016-01-14 MED ORDER — DIPHENHYDRAMINE HCL 12.5 MG/5ML PO ELIX: 12.5000 mg | ORAL_SOLUTION | Freq: Four times a day (QID) | ORAL | Status: DC | PRN

## 2016-01-14 MED ORDER — SUGAMMADEX SODIUM 200 MG/2ML IV SOLN
INTRAVENOUS | Status: AC
  Filled 2016-01-14: qty 2

## 2016-01-14 MED ORDER — EPHEDRINE 5 MG/ML INJ
INTRAVENOUS | Status: AC
  Filled 2016-01-14: qty 10

## 2016-01-14 MED ORDER — ONDANSETRON HCL 4 MG/2ML IJ SOLN
4.0000 mg | Freq: Four times a day (QID) | INTRAMUSCULAR | Status: DC | PRN
  Administered 2016-01-15 (×2): 4 mg via INTRAVENOUS
  Filled 2016-01-14 (×2): qty 2

## 2016-01-14 MED ORDER — SUCCINYLCHOLINE CHLORIDE 200 MG/10ML IV SOSY
PREFILLED_SYRINGE | INTRAVENOUS | Status: AC
  Filled 2016-01-14: qty 10

## 2016-01-14 MED ORDER — LACTATED RINGERS IR SOLN
Status: DC | PRN
  Administered 2016-01-14: 3000 mL

## 2016-01-14 MED ORDER — CHLORHEXIDINE GLUCONATE 0.12 % MT SOLN
15.0000 mL | Freq: Two times a day (BID) | OROMUCOSAL | Status: DC
  Administered 2016-01-14 – 2016-01-20 (×9): 15 mL via OROMUCOSAL
  Filled 2016-01-14 (×12): qty 15

## 2016-01-14 MED ORDER — ACETAMINOPHEN 500 MG PO TABS
1000.0000 mg | ORAL_TABLET | ORAL | Status: AC
  Administered 2016-01-14: 1000 mg via ORAL
  Filled 2016-01-14: qty 2

## 2016-01-14 MED ORDER — DIPHENHYDRAMINE HCL 50 MG/ML IJ SOLN: 12.5000 mg | Freq: Four times a day (QID) | INTRAMUSCULAR | Status: DC | PRN

## 2016-01-14 MED ORDER — ALBUMIN HUMAN 5 % IV SOLN
INTRAVENOUS | Status: DC | PRN
  Administered 2016-01-14: 09:00:00 via INTRAVENOUS

## 2016-01-14 MED ORDER — CEFOTETAN DISODIUM-DEXTROSE 2-2.08 GM-% IV SOLR
INTRAVENOUS | Status: AC
  Filled 2016-01-14: qty 50

## 2016-01-14 SURGICAL SUPPLY — 60 items
BLADE EXTENDED COATED 6.5IN (ELECTRODE) IMPLANT
CABLE HIGH FREQUENCY MONO STRZ (ELECTRODE) ×2 IMPLANT
CELLS DAT CNTRL 66122 CELL SVR (MISCELLANEOUS) ×1 IMPLANT
CHLORAPREP W/TINT 26ML (MISCELLANEOUS) ×2 IMPLANT
COUNTER NEEDLE 20 DBL MAG RED (NEEDLE) ×2 IMPLANT
COVER MAYO STAND STRL (DRAPES) ×6 IMPLANT
COVER SURGICAL LIGHT HANDLE (MISCELLANEOUS) ×2 IMPLANT
DECANTER SPIKE VIAL GLASS SM (MISCELLANEOUS) ×2 IMPLANT
DERMABOND ADVANCED (GAUZE/BANDAGES/DRESSINGS) ×1
DERMABOND ADVANCED .7 DNX12 (GAUZE/BANDAGES/DRESSINGS) ×1 IMPLANT
DRAIN CHANNEL 19F RND (DRAIN) ×2 IMPLANT
DRAPE LAPAROSCOPIC ABDOMINAL (DRAPES) ×2 IMPLANT
DRAPE SURG IRRIG POUCH 19X23 (DRAPES) ×2 IMPLANT
DRSG OPSITE POSTOP 4X8 (GAUZE/BANDAGES/DRESSINGS) ×2 IMPLANT
ELECT PENCIL ROCKER SW 15FT (MISCELLANEOUS) ×4 IMPLANT
ELECT REM PT RETURN 15FT ADLT (MISCELLANEOUS) ×2 IMPLANT
EVACUATOR SILICONE 100CC (DRAIN) IMPLANT
GAUZE SPONGE 4X4 12PLY STRL (GAUZE/BANDAGES/DRESSINGS) IMPLANT
GLOVE BIO SURGEON STRL SZ 6.5 (GLOVE) ×4 IMPLANT
GLOVE BIOGEL PI IND STRL 7.0 (GLOVE) ×2 IMPLANT
GLOVE BIOGEL PI INDICATOR 7.0 (GLOVE) ×2
GOWN STRL REUS W/TWL 2XL LVL3 (GOWN DISPOSABLE) ×4 IMPLANT
GOWN STRL REUS W/TWL XL LVL3 (GOWN DISPOSABLE) ×8 IMPLANT
GRASPER ENDOPATH ANVIL 10MM (MISCELLANEOUS) IMPLANT
HOLDER FOLEY CATH W/STRAP (MISCELLANEOUS) ×2 IMPLANT
IRRIG SUCT STRYKERFLOW 2 WTIP (MISCELLANEOUS) ×2
IRRIGATION SUCT STRKRFLW 2 WTP (MISCELLANEOUS) ×1 IMPLANT
LUBRICANT JELLY K Y 4OZ (MISCELLANEOUS) ×2 IMPLANT
PACK COLON (CUSTOM PROCEDURE TRAY) ×2 IMPLANT
PAD POSITIONING PINK XL (MISCELLANEOUS) ×2 IMPLANT
PORT LAP GEL ALEXIS MED 5-9CM (MISCELLANEOUS) ×2 IMPLANT
POSITIONER SURGICAL ARM (MISCELLANEOUS) ×2 IMPLANT
RELOAD PROXIMATE 75MM BLUE (ENDOMECHANICALS) ×4 IMPLANT
RTRCTR WOUND ALEXIS 18CM MED (MISCELLANEOUS) ×2
SCISSORS LAP 5X35 DISP (ENDOMECHANICALS) ×2 IMPLANT
SEALER TISSUE G2 STRG ARTC 35C (ENDOMECHANICALS) ×2 IMPLANT
SLEEVE XCEL OPT CAN 5 100 (ENDOMECHANICALS) ×4 IMPLANT
STAPLER GUN LINEAR PROX 60 (STAPLE) ×2 IMPLANT
STAPLER PROXIMATE 75MM BLUE (STAPLE) ×2 IMPLANT
STAPLER VISISTAT 35W (STAPLE) IMPLANT
SUT ETHILON 2 0 PS N (SUTURE) ×2 IMPLANT
SUT NOVA NAB GS-21 0 18 T12 DT (SUTURE) ×6 IMPLANT
SUT PDS AB 1 TP1 96 (SUTURE) IMPLANT
SUT PROLENE 2 0 KS (SUTURE) ×2 IMPLANT
SUT SILK 2 0 (SUTURE) ×1
SUT SILK 2 0 SH CR/8 (SUTURE) ×2 IMPLANT
SUT SILK 2-0 18XBRD TIE 12 (SUTURE) ×1 IMPLANT
SUT SILK 3 0 (SUTURE) ×1
SUT SILK 3 0 SH CR/8 (SUTURE) ×4 IMPLANT
SUT SILK 3-0 18XBRD TIE 12 (SUTURE) ×1 IMPLANT
SUT VIC AB 2-0 SH 18 (SUTURE) ×4 IMPLANT
SUT VIC AB 2-0 SH 27 (SUTURE) ×2
SUT VIC AB 2-0 SH 27X BRD (SUTURE) ×2 IMPLANT
SUT VIC AB 4-0 PS2 27 (SUTURE) ×2 IMPLANT
TOWEL OR NON WOVEN STRL DISP B (DISPOSABLE) ×2 IMPLANT
TRAY FOLEY W/METER SILVER 16FR (SET/KITS/TRAYS/PACK) IMPLANT
TROCAR BLADELESS OPT 5 100 (ENDOMECHANICALS) ×2 IMPLANT
TROCAR XCEL BLUNT TIP 100MML (ENDOMECHANICALS) IMPLANT
TUBING CONNECTING 10 (TUBING) ×2 IMPLANT
TUBING INSUF HEATED (TUBING) ×2 IMPLANT

## 2016-01-14 NOTE — Interval H&P Note (Signed)
History and Physical Interval Note:  01/14/2016 7:23 AM  Todd French  has presented today for surgery, with the diagnosis of colon cancer  The various methods of treatment have been discussed with the patient and family. After consideration of risks, benefits and other options for treatment, the patient has consented to  Procedure(s): LAPAROSCOPIC PARTIAL COLECTOMY (N/A) as a surgical intervention .  The patient's history has been reviewed, patient examined, no change in status, stable for surgery.  I have reviewed the patient's chart and labs.  Questions were answered to the patient's satisfaction.     Rosario Adie, MD  [facility]

## 2016-01-14 NOTE — Anesthesia Preprocedure Evaluation (Signed)
Anesthesia Evaluation  Patient identified by MRN, date of birth, ID band Patient awake    Reviewed: Allergy & Precautions, NPO status , Patient's Chart, lab work & pertinent test results  Airway Mallampati: II  TM Distance: >3 FB     Dental   Pulmonary former smoker,    breath sounds clear to auscultation       Cardiovascular hypertension, + Past MI   Rhythm:Regular Rate:Normal     Neuro/Psych    GI/Hepatic Neg liver ROS, GERD  ,  Endo/Other  negative endocrine ROS  Renal/GU negative Renal ROS     Musculoskeletal  (+) Arthritis ,   Abdominal   Peds  Hematology   Anesthesia Other Findings   Reproductive/Obstetrics                             Anesthesia Physical Anesthesia Plan  ASA: III  Anesthesia Plan: General   Post-op Pain Management:    Induction: Intravenous  Airway Management Planned: Oral ETT  Additional Equipment:   Intra-op Plan:   Post-operative Plan: Possible Post-op intubation/ventilation  Informed Consent: I have reviewed the patients History and Physical, chart, labs and discussed the procedure including the risks, benefits and alternatives for the proposed anesthesia with the patient or authorized representative who has indicated his/her understanding and acceptance.   Dental advisory given  Plan Discussed with: CRNA and Anesthesiologist  Anesthesia Plan Comments:         Anesthesia Quick Evaluation

## 2016-01-14 NOTE — Anesthesia Postprocedure Evaluation (Signed)
Anesthesia Post Note  Patient: Todd French  Procedure(s) Performed: Procedure(s) (LRB): LAPAROSCOPIC PARTIAL COLECTOMY (N/A)  Patient location during evaluation: PACU Anesthesia Type: General Level of consciousness: awake Pain management: pain level controlled Vital Signs Assessment: post-procedure vital signs reviewed and stable Respiratory status: spontaneous breathing Cardiovascular status: stable Anesthetic complications: no    Last Vitals:  Vitals:   01/14/16 1235 01/14/16 1336  BP: (!) 100/53 (!) 96/52  Pulse: 78 77  Resp: 14 14  Temp: 36.5 C 36.5 C    Last Pain:  Vitals:   01/14/16 1336  TempSrc: Oral  PainSc:                  EDWARDS,Kadan Millstein

## 2016-01-14 NOTE — H&P (View-Only) (Signed)
History of Present Illness Todd Ruff MD; [id number] 9:54 AM) The patient is a 80 year old male who presents with colorectal cancer. 80 year old male who presents to the office for evaluation of a newly diagnosed colon cancer. He underwent surveillance colonoscopy with Dr. Hilarie Fredrickson. This showed several polyps throughout his transverse and descending colon and a hepatic flexure mass that was unable to be traversed. Biopsies were positive for adenocarcinoma. CT scans of chest abdomen and pelvis show no signs of metastatic disease. Patient states she has had some intermittent constipation and abdominal pain recently. He denies any blood in his stool or black tarry stools. He denies any weight loss.   Other Problems Mammie Lorenzo, LPN; 075-GRM 075-GRM AM) Back Pain Colon Cancer Diverticulosis Gastroesophageal Reflux Disease Hemorrhoids High blood pressure Hypercholesterolemia Myocardial infarction  Past Surgical History Mammie Lorenzo, LPN; 075-GRM 075-GRM AM) Cataract Surgery Bilateral. Colon Polyp Removal - Colonoscopy Shoulder Surgery Bilateral.  Diagnostic Studies History Mammie Lorenzo, LPN; 075-GRM 075-GRM AM) Colonoscopy within last year  Allergies Mammie Lorenzo, LPN; 075-GRM [id number] AM) No Known Drug Allergies 12/24/2015  Medication History Mammie Lorenzo, LPN; 075-GRM 075-GRM AM) Vaseretic (10-25MG  Tablet, Oral) Active. Lipitor (10MG  Tablet, Oral) Active. Aspirin (81MG  Tablet DR, Oral) Active. Claritin-D 24 Hour (10-240MG  Tablet ER 24HR, Oral) Active. Zantac (150MG  Tablet, Oral) Active. Ciloxan (0.3% Solution, Ophthalmic) Active. Colon Cleanse (Oral) Active. Medications Reconciled  Social History Mammie Lorenzo, LPN; 075-GRM 075-GRM AM) Alcohol use Occasional alcohol use. Caffeine use Carbonated beverages, Coffee, Tea. No drug use Tobacco use Former smoker.  Family History Mammie Lorenzo, LPN; 075-GRM 075-GRM AM) Arthritis  Mother. Cerebrovascular Accident Father. Heart Disease Father.     Review of Systems Claiborne Billings Dockery LPN; 075-GRM 075-GRM AM) General Not Present- Appetite Loss, Chills, Fatigue, Fever, Night Sweats, Weight Gain and Weight Loss. Skin Present- Dryness. Not Present- Change in Wart/Mole, Hives, Jaundice, New Lesions, Non-Healing Wounds, Rash and Ulcer. HEENT Present- Hearing Loss, Seasonal Allergies and Wears glasses/contact lenses. Not Present- Earache, Hoarseness, Nose Bleed, Oral Ulcers, Ringing in the Ears, Sinus Pain, Sore Throat, Visual Disturbances and Yellow Eyes. Respiratory Present- Snoring. Not Present- Bloody sputum, Chronic Cough, Difficulty Breathing and Wheezing. Breast Not Present- Breast Mass, Breast Pain, Nipple Discharge and Skin Changes. Cardiovascular Present- Leg Cramps and Shortness of Breath. Not Present- Chest Pain, Difficulty Breathing Lying Down, Palpitations, Rapid Heart Rate and Swelling of Extremities. Gastrointestinal Present- Constipation. Not Present- Abdominal Pain, Bloating, Bloody Stool, Change in Bowel Habits, Chronic diarrhea, Difficulty Swallowing, Excessive gas, Gets full quickly at meals, Hemorrhoids, Indigestion, Nausea, Rectal Pain and Vomiting. Male Genitourinary Present- Frequency and Urgency. Not Present- Blood in Urine, Change in Urinary Stream, Impotence, Nocturia, Painful Urination and Urine Leakage. Musculoskeletal Not Present- Back Pain, Joint Pain, Joint Stiffness, Muscle Pain, Muscle Weakness and Swelling of Extremities. Neurological Not Present- Decreased Memory, Fainting, Headaches, Numbness, Seizures, Tingling, Tremor, Trouble walking and Weakness. Psychiatric Not Present- Anxiety, Bipolar, Change in Sleep Pattern, Depression, Fearful and Frequent crying. Endocrine Not Present- Cold Intolerance, Excessive Hunger, Hair Changes, Heat Intolerance, Hot flashes and New Diabetes. Hematology Present- Easy Bruising. Not Present- Blood Thinners,  Excessive bleeding, Gland problems, HIV and Persistent Infections.  Vitals Claiborne Billings Dockery LPN; 075-GRM [id number] AM) 12/24/2015 9:36 AM Weight: 155.2 lb Height: 67in Body Surface Area: 1.82 m Body Mass Index: 24.31 kg/m  Temp.: 97.90F(Oral)  Pulse: 86 (Regular)  BP: 124/72 (Sitting, Left Arm, Standard)      Physical Exam Todd Ruff MD; [id number] 9:55 AM)  General Mental Status-Alert. General Appearance-Not in acute distress.  Build & Nutrition-Well nourished. Posture-Normal posture. Gait-Normal.  Head and Neck Head-normocephalic, atraumatic with no lesions or palpable masses. Trachea-midline.  Chest and Lung Exam Chest and lung exam reveals -on auscultation, normal breath sounds, no adventitious sounds and normal vocal resonance.  Cardiovascular Cardiovascular examination reveals -normal heart sounds, regular rate and rhythm with no murmurs and no digital clubbing, cyanosis, edema, increased warmth or tenderness.  Abdomen Inspection Inspection of the abdomen reveals - No Hernias. Palpation/Percussion Palpation and Percussion of the abdomen reveal - Soft, Non Tender, No Rigidity (guarding), No hepatosplenomegaly and No Palpable abdominal masses.  Neurologic Neurologic evaluation reveals -alert and oriented x 3 with no impairment of recent or remote memory, normal attention span and ability to concentrate, normal sensation and normal coordination.  Musculoskeletal Normal Exam - Bilateral-Upper Extremity Strength Normal and Lower Extremity Strength Normal.    Assessment & Plan Todd Ruff MD; Q000111Q 9:58 AM)  COLON CANCER, ASCENDING (C18.2) Impression: 80 year old male who presents to the office for evaluation of a newly diagnosed colon cancer. This was noted to be in the hepatic flexure on CT scan and colonoscopy. It was tattooed. CT scans show no signs of metastatic disease. I have recommended a left Right colectomy. I  think he should tolerate this well. We will touch base with this cardiology office just to make sure there is no further testing needs to be done prior to anesthesia. We will get a CEA level with his preoperative lab work. The surgery and anatomy were described to the patient as well as the risks of surgery and the possible complications. These include: Bleeding, deep abdominal infections and possible wound complications such as hernia and infection, damage to adjacent structures, leak of surgical connections, which can lead to other surgeries and possibly an ostomy, possible need for other procedures, such as abscess drains in radiology, possible prolonged hospital stay, possible diarrhea from removal of part of the colon, possible constipation from narcotics, possible bowel, bladder or sexual dysfunction if having rectal surgery, prolonged fatigue/weakness or appetite loss, possible early recurrence of of disease, possible complications of their medical problems such as heart disease or arrhythmias or lung problems, death (less than 1%). I believe the patient understands and wishes to proceed with the surgery.

## 2016-01-14 NOTE — Progress Notes (Signed)
Pt dangled and attempted to stand at bedside x 2 this evening post surgery yet unable to walk due to dizziness and lightheadedness. Clammy. Back to bed both times. VSS. Alert and oriented x 4. Reassurance given to pt and family.

## 2016-01-14 NOTE — Anesthesia Procedure Notes (Signed)
Procedure Name: Intubation Date/Time: 01/14/2016 7:42 AM Performed by: Talbot Grumbling Pre-anesthesia Checklist: Patient identified, Emergency Drugs available, Suction available and Patient being monitored Patient Re-evaluated:Patient Re-evaluated prior to inductionOxygen Delivery Method: Circle system utilized Preoxygenation: Pre-oxygenation with 100% oxygen Intubation Type: IV induction and Rapid sequence Laryngoscope Size: Miller and 2 Grade View: Grade I Tube size: 7.0 mm Number of attempts: 1 Airway Equipment and Method: Stylet Placement Confirmation: ETT inserted through vocal cords under direct vision,  positive ETCO2 and breath sounds checked- equal and bilateral Secured at: 21 cm Tube secured with: Tape Dental Injury: Teeth and Oropharynx as per pre-operative assessment

## 2016-01-14 NOTE — Transfer of Care (Signed)
Immediate Anesthesia Transfer of Care Note  Patient: Todd French  Procedure(s) Performed: Procedure(s): LAPAROSCOPIC PARTIAL COLECTOMY (N/A)  Patient Location: PACU  Anesthesia Type:General  Level of Consciousness:  sedated, patient cooperative and responds to stimulation  Airway & Oxygen Therapy:Patient Spontanous Breathing and Patient connected to face mask oxgen  Post-op Assessment:  Report given to PACU RN and Post -op Vital signs reviewed and stable  Post vital signs:  Reviewed and stable  Last Vitals:  Vitals:   01/14/16 0553  BP: (!) 152/72  Pulse: 87  Resp: 16  Temp: [id number] C    Complications: No apparent anesthesia complications

## 2016-01-14 NOTE — Op Note (Signed)
01/14/2016  10:40 AM  PATIENT:  Todd French  80 y.o. male  Patient Care Team: Delanna Notice, MD as PCP - General (Internal Medicine)  PRE-OPERATIVE DIAGNOSIS:  colon cancer  POST-OPERATIVE DIAGNOSIS:  colon cancer  PROCEDURE:   LAPAROSCOPIC RIGHT COLECTOMY  Surgeon(s): Leighton Ruff, MD  ASSISTANT: none   ANESTHESIA:   local and general  EBL:  Total I/O In: 2700 [I.V.:2200; IV Piggyback:500] Out: 400 [Urine:100; Blood:300]  SPECIMEN:  Source of Specimen:  terminal ileum and right colon with right tranverse colon  DISPOSITION OF SPECIMEN:  PATHOLOGY  COUNTS:  YES  PLAN OF CARE: Admit to inpatient   PATIENT DISPOSITION:  PACU - hemodynamically stable.   INDICATIONS: This is a 80 y.o. male who presented to my office with colon cancer seen at the hepatic flexure. The risk and benefits and alternative treatments were explained to the patient prior to the OR and the patient has elected to proceed with lap R colectomy.  Consent was signed and placed on chart prior to the OR.   OR FINDINGS:  aberrant vascular anatomy  DESCRIPTION:  The patient was identified & brought into the operating room. The patient was positioned supine with left arm tucked. SCDs were active during the entire case. The patient underwent general anesthesia without any difficulty. A foley catheter was inserted under sterile conditions. The abdomen was prepped and draped in a sterile fashion. A Surgical Timeout confirmed our plan.  I made a vertical incision above the umbilical fold. I dissected down through the subcutaneous tissues using electrocautery.  The fascia was incised at midline.  Blunt dissection was used to obtain peritoneal entry and then this was also divided with cautery. I placed an alexis wound protector.  We induced carbon dioxide insufflation. Camera inspection revealed no injury. The tattoo was not readily visible.  I placed additional ports under direct laparoscopic visualization.  I  evaluated the entire abdomen laparoscopically.  The liver appeared normal, the large and small bowel were normal as well.  There were no signs of metastatic disease.   I began by freeing the cecum and the appendix off its attachments to the pelvic wall. I mobilized the terminal ileum.  I took care to avoid injuring any retroperitoneal structures.  After this I began to mobilize laterally down the white line of [place] and then took down the hepatic flexure using the Enseal device. I mobilized the omentum off of the right transverse colon. The entire colon was then flipped medially and mobilized off of the retroperitoneal structures until I could visualize the lateral edge of the duodenum underneath.  I gently freed the duodenal attachments.  During this mobilization I did find the tattoo in the transverse colon just distal to the hepatic flexure.  I was now able to mobilize the colonic mesentery. I identified what I thought was the ileocolic artery and vein. I dissected out the mesentery bluntly. The duodenum was identified on the other side and clear from the dissection. I could not identify an artery and vein. I divided the mesentery along the level just superior to the SVC down to the level of the terminal ileum.  I then mobilized the gastrocolic ligament using the Enseal device. There was significant inflammation around the tumor site which was adherent to tissues around the gallbladder. The gallbladder was separated from the colon using sharp dissection. I inspected the gallbladder closely. There was no obvious injury to the gallbladder that I could determine. At that point, I removed the cap  of the [place] port. The terminal ileum and right colon were then removed from the wound. The terminal ileum was transected using a GIA blue load stapler. The remaining mesentery was divided using the Enseal device. I identified a portion of the transverse colon just distal to the hepatic flexure. This was transected using  another blue load GIA stapler.  At this point I noticed that there was bleeding coming from the colonic mesentery. I placed the cap back on the Alexis and insufflated the abdomen.  I spent approximate 60 seconds trying to identify the area of bleeding laparoscopically. This did not appear to be feasible. I desufflated the abdomen and removed the Alexis cap.  I placed a sponge in the abdomen and held pressure where the bleeding appeared to be coming from. It was clearly venous bleeding.  I enlarged my incision with the other hand. We placed a large retractor inside the abdominal wall and gained exposure by packing the small bowel out of the way. We slowly remove the lap sponge after evacuating all the clot from the right upper quadrant.  We identified the bleeding coming from what appeared to be the pancreaticoduodenal vein or possibly an aberrant right vein coming off of the SVC. This was closed using a 3-0 Prolene suture. Hemostasis was good after this. I inspected the retroperitoneum. All mesentery was removed from just superior to the SVC to the level of the middle colic vein. The abdomen was then irrigated. The patient had some mesenteric bleeding from his terminal ileum mesentery. This was suture ligated using a 3-0 silk suture.  This devascularize a portion of the terminal ileum. An anastomosis was created between the viable portion of the terminal ileum and the mid transverse colon. This was done using a GIA blue load stapler.  The common enterotomy channel was closed using a TA 60 blue load stapler.  The remaining terminal ileum was divided with this stapler as well. Hemostasis was good at the staple line. Several 3-0 silk sutures were used to imbricate the edge of the anastomosis. An anti-tension suture was placed in the crotch of the anastomosis. This was then placed back into the abdomen. The abdomen was then irrigated with normal saline. Hemostasis was good. I placed a 72 [place] Blake drain into the  retroperitoneum of the right upper quadrant, mainly to monitor for bleeding. This was brought out through the left upper quadrant port site. It was sutured into place with a 2-0 nylon suture. The omentum was then brought down over the anastomosis. The Alexis wound protector was removed, and we switched to clean instruments, gowns and drapes.  The fascia was then closed using #0 Novafil interrupted sutures.  The subcutaneous tissue of the umbilical incision was closed using interrupted 2-0 Vicryl sutures. The skin was then closed using 4-0 Vicryl sutures. Dermabond was placed on the port sites and a sterile dressing was placed over the abdominal incision. All counts were correct per operating room staff. The patient was then awakened from anesthesia and sent to the post anesthesia care unit in stable condition.

## 2016-01-15 LAB — BASIC METABOLIC PANEL
Anion gap: 7 (ref 5–15)
BUN: 18 mg/dL (ref 6–20)
CHLORIDE: 107 mmol/L (ref 101–111)
CO2: 23 mmol/L (ref 22–32)
CREATININE: 1.09 mg/dL (ref 0.61–1.24)
Calcium: 8 mg/dL — ABNORMAL LOW (ref 8.9–10.3)
GFR calc Af Amer: >60 mL/min (ref >60)
GFR calc non Af Amer: >60 mL/min (ref >60)
GLUCOSE: 149 mg/dL — AB (ref 65–99)
Potassium: 4.4 mmol/L (ref 3.5–5.1)
SODIUM: 137 mmol/L (ref 135–145)

## 2016-01-15 LAB — PREPARE RBC (CROSSMATCH)

## 2016-01-15 LAB — CBC
HCT: 18.9 % — ABNORMAL LOW (ref 39.0–52.0)
Hemoglobin: 6.1 g/dL — CL (ref 13.0–17.0)
MCH: 24 pg — AB (ref 26.0–34.0)
MCHC: 32.3 g/dL (ref 30.0–36.0)
MCV: 74.4 fL — AB (ref 78.0–100.0)
PLATELETS: 319 10*3/uL (ref 150–400)
RBC: 2.54 MIL/uL — ABNORMAL LOW (ref 4.22–5.81)
RDW: 16.6 % — AB (ref 11.5–15.5)
WBC: 15.5 10*3/uL — ABNORMAL HIGH (ref 4.0–10.5)

## 2016-01-15 LAB — HEMOGLOBIN: Hemoglobin: 8.3 g/dL — ABNORMAL LOW (ref 13.0–17.0)

## 2016-01-15 MED ORDER — ONDANSETRON HCL 4 MG/2ML IJ SOLN
4.0000 mg | Freq: Four times a day (QID) | INTRAMUSCULAR | Status: DC | PRN
  Administered 2016-01-19: 4 mg via INTRAVENOUS
  Filled 2016-01-15: qty 2

## 2016-01-15 MED ORDER — PSEUDOEPHEDRINE HCL ER 120 MG PO TB12
120.0000 mg | ORAL_TABLET | Freq: Two times a day (BID) | ORAL | Status: DC
  Administered 2016-01-15 – 2016-01-20 (×11): 120 mg via ORAL
  Filled 2016-01-15 (×11): qty 1

## 2016-01-15 MED ORDER — PROCHLORPERAZINE EDISYLATE 5 MG/ML IJ SOLN
5.0000 mg | Freq: Four times a day (QID) | INTRAMUSCULAR | Status: DC | PRN
  Administered 2016-01-15: 5 mg via INTRAVENOUS
  Administered 2016-01-16: 10 mg via INTRAVENOUS
  Filled 2016-01-15 (×2): qty 2

## 2016-01-15 MED ORDER — LORATADINE 10 MG PO TABS
10.0000 mg | ORAL_TABLET | Freq: Every day | ORAL | Status: DC
  Administered 2016-01-15 – 2016-01-20 (×6): 10 mg via ORAL
  Filled 2016-01-15 (×6): qty 1

## 2016-01-15 MED ORDER — SODIUM CHLORIDE 0.9 % IV SOLN: Freq: Once | INTRAVENOUS | Status: DC

## 2016-01-15 MED ORDER — ONDANSETRON HCL 4 MG PO TABS: 4.0000 mg | ORAL_TABLET | Freq: Four times a day (QID) | ORAL | Status: DC | PRN

## 2016-01-15 MED ORDER — METOCLOPRAMIDE HCL 5 MG/ML IJ SOLN: 5.0000 mg | Freq: Four times a day (QID) | INTRAMUSCULAR | Status: DC | PRN

## 2016-01-15 NOTE — Progress Notes (Signed)
PT Cancellation Note  Patient Details Name: Todd French MRN: [id number] DOB: Feb 24, 1935   Cancelled Treatment:     PT order received but eval deferred this am.  Pt with Hgb 6.1 with transfusion ordered.  Will follow.   Anicka Stuckert 01/15/2016, 8:01 AM

## 2016-01-15 NOTE — Progress Notes (Signed)
1 Day Post-Op lap assisted R colectomy Subjective: Pt with some dizziness upon standing.  No nausea.  Min right sided pain.   Objective: Vital signs in last 24 hours: Temp:  [97.3 F (36.3 C)-98.2 F (36.8 C)] 98.1 F (36.7 C) (12/06 0551) Pulse Rate:  [65-91] 65 (12/06 0551) Resp:  [9-16] 16 (12/06 0551) BP: (86-118)/(51-64) 118/60 (12/06 0551) SpO2:  [97 %-100 %] 100 % (12/06 0551)   Intake/Output from previous day: 12/05 0701 - 12/06 0700 In: 4868.3 [I.V.:4368.3; IV Piggyback:500] Out: 2005 [Urine:1265; Drains:440; Blood:300] Intake/Output this shift: No intake/output data recorded.   General appearance: alert and cooperative GI: normal findings: soft, nontender  Incision: no significant drainage  Lab Results:   Recent Labs  01/15/16 0438  WBC 15.5*  HGB 6.1*  HCT 18.9*  PLT 319   BMET  Recent Labs  01/15/16 0438  NA 137  K 4.4  CL 107  CO2 23  GLUCOSE 149*  BUN 18  CREATININE 1.09  CALCIUM 8.0*   PT/INR No results for input(s): LABPROT, INR in the last 72 hours. ABG No results for input(s): PHART, HCO3 in the last 72 hours.  Invalid input(s): PCO2, PO2  MEDS, Scheduled . sodium chloride   Intravenous Once  . alvimopan  12 mg Oral BID  . chlorhexidine  15 mL Mouth Rinse BID  . cycloSPORINE  1 drop Both Eyes Daily  . enalapril  10 mg Oral Daily   And  . hydrochlorothiazide  25 mg Oral Daily  . enoxaparin (LOVENOX) injection  40 mg Subcutaneous Q24H  . famotidine  20 mg Oral Daily  . loratadine  10 mg Oral Daily   And  . pseudoephedrine  120 mg Oral Q12H  . mouth rinse  15 mL Mouth Rinse q12n4p  . prednisoLONE acetate  1 drop Right Eye TID    Studies/Results: No results found.  Assessment: s/p Procedure(s): LAPAROSCOPIC PARTIAL COLECTOMY Patient Active Problem List   Diagnosis Date Noted  . Colon cancer ([place]) 01/14/2016    Acute blood loss anemia  Plan: sips of clears today  Transfuse 1 u pRBCs.  Will recheck after that for  need for a 2nd unit Ambulate once transfusion complete Cont foley   LOS: 1 day     .Rosario Adie, MD [facility], [place]   01/15/2016 8:05 AM

## 2016-01-15 NOTE — Evaluation (Signed)
Physical Therapy Evaluation Patient Details Name: Todd French MRN: [id number] DOB: [birth year] Today's Date: 01/15/2016   History of Present Illness  Pt with dx colon ca and s/p laparoscopic R colectomy.  Clinical Impression  Pt admitted as above and presenting with functional mobility limitations 2* post op pain and mild ambulatory instability.  Pt should progress to dc home with family assist.    Follow Up Recommendations No PT follow up    Equipment Recommendations  None recommended by PT    Recommendations for Other Services OT consult     Precautions / Restrictions Precautions Precautions: Fall Precaution Comments: JP drain on L Restrictions Weight Bearing Restrictions: No      Mobility  Bed Mobility Overal bed mobility: Needs Assistance Bed Mobility: Supine to Sit           General bed mobility comments: cues and min assist for log roll technique and transition to sitting  Transfers Overall transfer level: Needs assistance Equipment used: Rolling walker (2 wheeled) Transfers: Sit to/from Stand Sit to Stand: Min assist         General transfer comment: cues for transition position and use of UEs to self assist  Ambulation/Gait Ambulation/Gait assistance: Min assist Ambulation Distance (Feet): 450 Feet Assistive device: Rolling walker (2 wheeled) Gait Pattern/deviations: Step-through pattern;Shuffle;Decreased step length - right;Decreased step length - left Gait velocity: decr Gait velocity interpretation: Below normal speed for age/gender General Gait Details: cues for posture and position from RW  Stairs            Wheelchair Mobility    Modified Rankin (Stroke Patients Only)       Balance Overall balance assessment: Needs assistance Sitting-balance support: No upper extremity supported;Feet supported Sitting balance-Leahy Scale: Fair     Standing balance support: Bilateral upper extremity supported Standing balance-Leahy Scale:  Fair                               Pertinent Vitals/Pain Pain Assessment: 0-10 Pain Score: 6  Pain Location: abdomen Pain Descriptors / Indicators: Sore Pain Intervention(s): Limited activity within patient's tolerance;Monitored during session;Patient requesting pain meds-RN notified    Home Living Family/patient expects to be discharged to:: Private residence Living Arrangements: Spouse/significant other Available Help at Discharge: Family Type of Home: House Home Access: Stairs to enter   [employer] of Steps: 1 Home Layout: One [place]: Environmental consultant - 2 wheels;Cane - single point      Prior Function Level of Independence: Independent               Hand Dominance        Extremity/Trunk Assessment   Upper Extremity Assessment: Overall WFL for tasks assessed           Lower Extremity Assessment: Overall WFL for tasks assessed      Cervical / Trunk Assessment: Normal  Communication   Communication: No difficulties  Cognition Arousal/Alertness: Awake/alert Behavior During Therapy: WFL for tasks assessed/performed Overall Cognitive Status: Within Functional Limits for tasks assessed                      General Comments      Exercises General Exercises - Lower Extremity Ankle Circles/Pumps: AROM;Both;15 reps;Supine   Assessment/Plan    PT Assessment Patient needs continued PT services  PT Problem List Decreased activity tolerance;Decreased balance;Decreased mobility;Decreased knowledge of use of DME;Pain  PT Treatment Interventions DME instruction;Gait training;Stair training;Functional mobility training;Therapeutic activities;Therapeutic exercise;Patient/family education    PT Goals (Current goals can be found in the Care Plan section)  Acute Rehab PT Goals Patient Stated Goal: Regain IND PT Goal Formulation: With patient Time For Goal Achievement: 01/29/16 Potential to Achieve Goals: Good     Frequency Min 3X/week   Barriers to discharge        Co-evaluation               End of Session   Activity Tolerance: Patient tolerated treatment well Patient left: in chair;with call bell/phone within reach;with family/visitor present Nurse Communication: Mobility status         Time: [id number] PT Time Calculation (min) (ACUTE ONLY): 23 min   Charges:   PT Evaluation $PT Eval Low Complexity: 1 Procedure PT Treatments $Gait Training: 8-22 mins   PT G Codes:        Akeiba Axelson 13-Feb-2016, 2:29 PM

## 2016-01-16 LAB — TYPE AND SCREEN
ABO/RH(D): A POS
Antibody Screen: NEGATIVE
UNIT DIVISION: 0

## 2016-01-16 LAB — BASIC METABOLIC PANEL
Anion gap: 5 (ref 5–15)
BUN: 12 mg/dL (ref 6–20)
CHLORIDE: 104 mmol/L (ref 101–111)
CO2: 27 mmol/L (ref 22–32)
CREATININE: 0.76 mg/dL (ref 0.61–1.24)
Calcium: 8.2 mg/dL — ABNORMAL LOW (ref 8.9–10.3)
GFR calc non Af Amer: >60 mL/min (ref >60)
Glucose, Bld: 139 mg/dL — ABNORMAL HIGH (ref 65–99)
Potassium: 3.9 mmol/L (ref 3.5–5.1)
Sodium: 136 mmol/L (ref 135–145)

## 2016-01-16 LAB — CBC
HEMATOCRIT: 25.3 % — AB (ref 39.0–52.0)
Hemoglobin: 8.3 g/dL — ABNORMAL LOW (ref 13.0–17.0)
MCH: 26.1 pg (ref 26.0–34.0)
MCHC: 32.8 g/dL (ref 30.0–36.0)
MCV: 79.6 fL (ref 78.0–100.0)
PLATELETS: 357 10*3/uL (ref 150–400)
RBC: 3.18 MIL/uL — AB (ref 4.22–5.81)
RDW: 18.2 % — ABNORMAL HIGH (ref 11.5–15.5)
WBC: 17.5 10*3/uL — ABNORMAL HIGH (ref 4.0–10.5)

## 2016-01-16 LAB — HEMOGLOBIN AND HEMATOCRIT, BLOOD
HCT: 24.3 % — ABNORMAL LOW (ref 39.0–52.0)
Hemoglobin: 7.9 g/dL — ABNORMAL LOW (ref 13.0–17.0)

## 2016-01-16 NOTE — Progress Notes (Signed)
Pt complains of dizziness when standing.  Orthostatic vital signs were obtained and Dr was notified that his BP drops significantly upon standing. Awaiting call back from Dr. Mertha Baars

## 2016-01-16 NOTE — Progress Notes (Addendum)
2 Days Post-Op lap assisted R colectomy Subjective: Dizziness resolved, having nausea  Objective: Vital signs in last 24 hours: Temp:  [97.6 F (36.4 C)-98.6 F (37 C)] 97.6 F (36.4 C) (12/07 0522) Pulse Rate:  [80-93] 93 (12/07 0522) Resp:  [16] 16 (12/07 0522) BP: (112-154)/(54-71) 134/60 (12/07 0522) SpO2:  [99 %-100 %] 100 % (12/07 0522)   Intake/Output from previous day: 12/06 0701 - 12/07 0700 In: 2180.4 [I.V.:1850.4; Blood:330] Out: 2385 [Urine:2075; Drains:310] Intake/Output this shift: Total I/O In: -  Out: 350 [Urine:300; Drains:50]   General appearance: alert and cooperative GI: normal findings: soft, nontender, distended  Incision: no significant drainage  Lab Results:   Recent Labs  01/15/16 0438 01/15/16 1411 01/16/16 0423  WBC 15.5*  --  17.5*  HGB 6.1* 8.3* 8.3*  HCT 18.9*  --  25.3*  PLT 319  --  357   BMET  Recent Labs  01/15/16 0438 01/16/16 0423  NA 137 136  K 4.4 3.9  CL 107 104  CO2 23 27  GLUCOSE 149* 139*  BUN 18 12  CREATININE 1.09 0.76  CALCIUM 8.0* 8.2*   PT/INR No results for input(s): LABPROT, INR in the last 72 hours. ABG No results for input(s): PHART, HCO3 in the last 72 hours.  Invalid input(s): PCO2, PO2  MEDS, Scheduled . sodium chloride   Intravenous Once  . alvimopan  12 mg Oral BID  . chlorhexidine  15 mL Mouth Rinse BID  . cycloSPORINE  1 drop Both Eyes Daily  . enalapril  10 mg Oral Daily   And  . hydrochlorothiazide  25 mg Oral Daily  . enoxaparin (LOVENOX) injection  40 mg Subcutaneous Q24H  . famotidine  20 mg Oral Daily  . loratadine  10 mg Oral Daily   And  . pseudoephedrine  120 mg Oral Q12H  . mouth rinse  15 mL Mouth Rinse q12n4p  . prednisoLONE acetate  1 drop Right Eye TID    Studies/Results: No results found.  Assessment: s/p Procedure(s): LAPAROSCOPIC PARTIAL COLECTOMY Patient Active Problem List   Diagnosis Date Noted  . Colon cancer ([place]) 01/14/2016    Acute blood loss  anemia Post op ileus  Plan: sips of clears today.  Pt has developed an ileus Acute blood loss anemia on chronic blood loss anemia: stable after transfusion yesterday.  Symptoms resolved Ambulate  D/c foley   LOS: 2 days     .Rosario Adie, [facility], [place] [phone]   01/16/2016 7:46 AM

## 2016-01-16 NOTE — Progress Notes (Signed)
PT Cancellation Note  Patient Details Name: Todd French MRN: [id number] DOB: 11-10-1935   Cancelled Treatment:     pt orthostatic when standing with nursing staff.  Will attempt another day as schedule permits.   Rica Koyanagi  PTA [facility]  Acute  Rehab Pager      [phone]

## 2016-01-17 LAB — BASIC METABOLIC PANEL
ANION GAP: 6 (ref 5–15)
BUN: 7 mg/dL (ref 6–20)
CALCIUM: 8.3 mg/dL — AB (ref 8.9–10.3)
CHLORIDE: 105 mmol/L (ref 101–111)
CO2: 24 mmol/L (ref 22–32)
Creatinine, Ser: 0.66 mg/dL (ref 0.61–1.24)
GFR calc Af Amer: >60 mL/min (ref >60)
GFR calc non Af Amer: >60 mL/min (ref >60)
GLUCOSE: 140 mg/dL — AB (ref 65–99)
Potassium: 3.8 mmol/L (ref 3.5–5.1)
Sodium: 135 mmol/L (ref 135–145)

## 2016-01-17 LAB — CBC
HEMATOCRIT: 23.9 % — AB (ref 39.0–52.0)
HEMOGLOBIN: 7.8 g/dL — AB (ref 13.0–17.0)
MCH: 25.8 pg — AB (ref 26.0–34.0)
MCHC: 32.6 g/dL (ref 30.0–36.0)
MCV: 79.1 fL (ref 78.0–100.0)
Platelets: 323 10*3/uL (ref 150–400)
RBC: 3.02 MIL/uL — ABNORMAL LOW (ref 4.22–5.81)
RDW: 18.6 % — ABNORMAL HIGH (ref 11.5–15.5)
WBC: 14.3 10*3/uL — ABNORMAL HIGH (ref 4.0–10.5)

## 2016-01-17 MED ORDER — FAMOTIDINE IN NACL 20-0.9 MG/50ML-% IV SOLN
20.0000 mg | Freq: Two times a day (BID) | INTRAVENOUS | Status: DC
  Administered 2016-01-17 – 2016-01-19 (×5): 20 mg via INTRAVENOUS
  Filled 2016-01-17 (×6): qty 50

## 2016-01-17 NOTE — Progress Notes (Signed)
3 Days Post-Op lap assisted R colectomy Subjective: Dizziness recurred yesterday afternoon, responded well to fluids, hgb stable, having less nausea, no flatus  Objective: Vital signs in last 24 hours: Temp:  [97.5 F (36.4 C)-98.3 F (36.8 C)] 97.9 F (36.6 C) (12/08 0445) Pulse Rate:  [63-102] 88 (12/08 0445) Resp:  [14-16] 14 (12/08 0445) BP: (93-126)/(43-64) 116/64 (12/08 0445) SpO2:  [93 %-97 %] 96 % (12/08 0445)   Intake/Output from previous day: 12/07 0701 - 12/08 0700 In: 2010 [I.V.:1990] Out: 2170 [Urine:1900; Drains:270] Intake/Output this shift: Total I/O In: -  Out: 245 [Urine:225; Drains:20]   General appearance: alert and cooperative GI: normal findings: soft, nontender, less distended  Incision: no significant drainage  Lab Results:   Recent Labs  01/16/16 0423 01/16/16 1648 01/17/16 0425  WBC 17.5*  --  14.3*  HGB 8.3* 7.9* 7.8*  HCT 25.3* 24.3* 23.9*  PLT 357  --  323   BMET  Recent Labs  01/16/16 0423 01/17/16 0425  NA 136 135  K 3.9 3.8  CL 104 105  CO2 27 24  GLUCOSE 139* 140*  BUN 12 7  CREATININE 0.76 0.66  CALCIUM 8.2* 8.3*   PT/INR No results for input(s): LABPROT, INR in the last 72 hours. ABG No results for input(s): PHART, HCO3 in the last 72 hours.  Invalid input(s): PCO2, PO2  MEDS, Scheduled . sodium chloride   Intravenous Once  . alvimopan  12 mg Oral BID  . chlorhexidine  15 mL Mouth Rinse BID  . cycloSPORINE  1 drop Both Eyes Daily  . enalapril  10 mg Oral Daily   And  . hydrochlorothiazide  25 mg Oral Daily  . enoxaparin (LOVENOX) injection  40 mg Subcutaneous Q24H  . famotidine (PEPCID) IV  20 mg Intravenous Q12H  . loratadine  10 mg Oral Daily   And  . pseudoephedrine  120 mg Oral Q12H  . mouth rinse  15 mL Mouth Rinse q12n4p  . prednisoLONE acetate  1 drop Right Eye TID    Studies/Results: No results found.  Assessment: s/p Procedure(s): LAPAROSCOPIC PARTIAL COLECTOMY Patient Active Problem  List   Diagnosis Date Noted  . Colon cancer ([place]) 01/14/2016    Acute blood loss anemia Post op ileus  Plan: sips of clears today.  Pt has developed an ileus Acute blood loss anemia on chronic blood loss anemia: stable after transfusion.   Ambulate     LOS: 3 days     .Rosario Adie, [facility], [place]   01/17/2016 10:51 AM

## 2016-01-17 NOTE — Progress Notes (Signed)
Physical Therapy Treatment Patient Details Name: Todd French MRN: [id number] DOB: 06/29/1935 Today's Date: 02-10-16    History of Present Illness Pt with dx colon ca and s/p laparoscopic R colectomy.    PT Comments    Pt progressing very well; no further PT needs; encouraged pt to amb with nsg staff as tol  Follow Up Recommendations  No PT follow up     Equipment Recommendations  None recommended by PT    Recommendations for Other Services       Precautions / Restrictions Precautions Precautions: Fall Precaution Comments: JP drain on L Restrictions Weight Bearing Restrictions: No    Mobility  Bed Mobility   Bed Mobility: Rolling;Sidelying to Sit Rolling: Supervision;Min guard Sidelying to sit: Supervision;Min guard       General bed mobility comments: cues for log roll technique  Transfers Overall transfer level: Needs assistance Equipment used: None Transfers: Sit to/from Stand Sit to Stand: Modified independent (Device/Increase time)            Ambulation/Gait   Ambulation Distance (Feet): 620 Feet Assistive device: None Gait Pattern/deviations: Step-through pattern;Trunk flexed     General Gait Details: cues for posture, breathing; no physical assist, no LOB   Stairs            Wheelchair Mobility    Modified Rankin (Stroke Patients Only)       Balance                             High level balance activites: Backward walking;Direction changes;Turns;Sudden stops;Head turns High Level Balance Comments: no LOB with above;     Cognition Arousal/Alertness: Awake/alert Behavior During Therapy: WFL for tasks assessed/performed Overall Cognitive Status: Within Functional Limits for tasks assessed                      Exercises      General Comments        Pertinent Vitals/Pain Pain Assessment: No/denies pain    Home Living                      Prior Function            PT Goals (current  goals can now be found in the care plan section) Acute Rehab PT Goals Patient Stated Goal: Regain IND PT Goal Formulation: With patient Time For Goal Achievement: 01/29/16 Potential to Achieve Goals: Good Progress towards PT goals: Goals met and updated - see care plan    Frequency    Min 3X/week      PT Plan Discharge plan needs to be updated    Co-evaluation             End of Session   Activity Tolerance: Patient tolerated treatment well Patient left: in chair;with call bell/phone within reach;with chair alarm set;with family/visitor present     Time: 7829-5621 PT Time Calculation (min) (ACUTE ONLY): 14 min  Charges:  $Gait Training: 8-22 mins                    G Codes:      Tanazia Achee 02-10-16, 11:35 AM

## 2016-01-18 LAB — CBC
HEMATOCRIT: 24.1 % — AB (ref 39.0–52.0)
Hemoglobin: 7.7 g/dL — ABNORMAL LOW (ref 13.0–17.0)
MCH: 25.6 pg — AB (ref 26.0–34.0)
MCHC: 32 g/dL (ref 30.0–36.0)
MCV: 80.1 fL (ref 78.0–100.0)
Platelets: 312 10*3/uL (ref 150–400)
RBC: 3.01 MIL/uL — ABNORMAL LOW (ref 4.22–5.81)
RDW: 18.7 % — AB (ref 11.5–15.5)
WBC: 10.9 10*3/uL — ABNORMAL HIGH (ref 4.0–10.5)

## 2016-01-18 MED ORDER — ACETAMINOPHEN 325 MG PO TABS
325.0000 mg | ORAL_TABLET | ORAL | Status: DC | PRN
  Administered 2016-01-18: 325 mg via ORAL
  Filled 2016-01-18: qty 1

## 2016-01-18 NOTE — Progress Notes (Signed)
4 Days Post-Op lap assisted R colectomy Subjective: Feeling better, min nausea, had a BM  Objective: Vital signs in last 24 hours: Temp:  [97.7 F (36.5 C)-98.1 F (36.7 C)] 98 F (36.7 C) (12/09 0630) Pulse Rate:  [87-91] 87 (12/09 0630) Resp:  [14-16] 16 (12/09 0630) BP: (109-122)/(53-63) 122/60 (12/09 0630) SpO2:  [97 %-98 %] 98 % (12/09 0630)   Intake/Output from previous day: 12/08 0701 - 12/09 0700 In: 3227 [P.O.:177; I.V.:3000; IV Piggyback:50] Out: 725 [Urine:525; Drains:200] Intake/Output this shift: Total I/O In: 60 [P.O.:60] Out: -    General appearance: alert and cooperative GI: normal findings: soft, nontender, less distended  Incision: no significant drainage  Lab Results:   Recent Labs  01/17/16 0425 01/18/16 0500  WBC 14.3* 10.9*  HGB 7.8* 7.7*  HCT 23.9* 24.1*  PLT 323 312   BMET  Recent Labs  01/16/16 0423 01/17/16 0425  NA 136 135  K 3.9 3.8  CL 104 105  CO2 27 24  GLUCOSE 139* 140*  BUN 12 7  CREATININE 0.76 0.66  CALCIUM 8.2* 8.3*   PT/INR No results for input(s): LABPROT, INR in the last 72 hours. ABG No results for input(s): PHART, HCO3 in the last 72 hours.  Invalid input(s): PCO2, PO2  MEDS, Scheduled . sodium chloride   Intravenous Once  . chlorhexidine  15 mL Mouth Rinse BID  . cycloSPORINE  1 drop Both Eyes Daily  . enalapril  10 mg Oral Daily   And  . hydrochlorothiazide  25 mg Oral Daily  . enoxaparin (LOVENOX) injection  40 mg Subcutaneous Q24H  . famotidine (PEPCID) IV  20 mg Intravenous Q12H  . loratadine  10 mg Oral Daily   And  . pseudoephedrine  120 mg Oral Q12H  . mouth rinse  15 mL Mouth Rinse q12n4p  . prednisoLONE acetate  1 drop Right Eye TID    Studies/Results: No results found.  Assessment: s/p Procedure(s): LAPAROSCOPIC PARTIAL COLECTOMY Patient Active Problem List   Diagnosis Date Noted  . Colon cancer ([place]) 01/14/2016    Acute blood loss anemia Post op ileus  Plan: Advance to  FLD.  Ileus seems to be resolving Acute blood loss anemia on chronic blood loss anemia: stable after transfusion of 1 unit.  Will have Pt start PO Iron once bowel function is better   Tylenol for mild pain Decrease IV fluids Ambulate     LOS: 4 days     .Rosario Adie, MD [facility], [place]   01/18/2016 10:25 AM

## 2016-01-19 MED ORDER — FAMOTIDINE 20 MG PO TABS
20.0000 mg | ORAL_TABLET | Freq: Two times a day (BID) | ORAL | Status: DC
  Administered 2016-01-19 – 2016-01-20 (×2): 20 mg via ORAL
  Filled 2016-01-19 (×2): qty 1

## 2016-01-19 MED ORDER — TRAMADOL HCL 50 MG PO TABS: 50.0000 mg | ORAL_TABLET | Freq: Four times a day (QID) | ORAL | Status: DC | PRN

## 2016-01-19 MED ORDER — FERROUS SULFATE 325 (65 FE) MG PO TABS
325.0000 mg | ORAL_TABLET | Freq: Three times a day (TID) | ORAL | Status: DC
  Administered 2016-01-19 – 2016-01-20 (×3): 325 mg via ORAL
  Filled 2016-01-19 (×3): qty 1

## 2016-01-19 NOTE — Progress Notes (Signed)
PHARMACIST - PHYSICIAN COMMUNICATION  DR:   Marcello Moores  CONCERNING: IV to Oral Route Change Policy  RECOMMENDATION: This patient is receiving Pepcid by the intravenous route.  Based on criteria approved by the Pharmacy and Therapeutics Committee, the intravenous medication(s) is/are being converted to the equivalent oral dose form(s).   DESCRIPTION: These criteria include:  The patient is eating (either orally or via tube) and/or has been taking other orally administered medications for a least 24 hours  The patient has no evidence of active gastrointestinal bleeding or impaired GI absorption (gastrectomy, short bowel, patient on TNA or NPO).  If you have questions about this conversion, please contact the Pharmacy Department  []   [phone] )  Todd French []   [phone] )  [facility] []   [phone] )  Todd French []   [phone] )  [facility] [x]   [phone] )  Todd French, PharmD, BCPS 01/19/2016 9:39 AM

## 2016-01-19 NOTE — Progress Notes (Signed)
5 Days Post-Op lap assisted R colectomy Subjective: Feeling better, no nausea, had several BM's  Objective: Vital signs in last 24 hours: Temp:  [98 F (36.7 C)-98.8 F (37.1 C)] 98.8 F (37.1 C) (12/10 0610) Pulse Rate:  [84-95] 84 (12/10 0610) Resp:  [16-18] 18 (12/10 0610) BP: (118-128)/(54-58) 119/58 (12/10 0610) SpO2:  [98 %-100 %] 99 % (12/10 0610)   Intake/Output from previous day: 12/09 0701 - 12/10 0700 In: 2147.1 [P.O.:780; I.V.:1367.1] Out: 65 [Drains:65] Intake/Output this shift: No intake/output data recorded.   General appearance: alert and cooperative GI: normal findings: soft, nontender, less distended  Incision: no significant drainage  Lab Results:   Recent Labs  01/17/16 0425 01/18/16 0500  WBC 14.3* 10.9*  HGB 7.8* 7.7*  HCT 23.9* 24.1*  PLT 323 312   BMET  Recent Labs  01/17/16 0425  NA 135  K 3.8  CL 105  CO2 24  GLUCOSE 140*  BUN 7  CREATININE 0.66  CALCIUM 8.3*   PT/INR No results for input(s): LABPROT, INR in the last 72 hours. ABG No results for input(s): PHART, HCO3 in the last 72 hours.  Invalid input(s): PCO2, PO2  MEDS, Scheduled . chlorhexidine  15 mL Mouth Rinse BID  . cycloSPORINE  1 drop Both Eyes Daily  . enalapril  10 mg Oral Daily   And  . hydrochlorothiazide  25 mg Oral Daily  . enoxaparin (LOVENOX) injection  40 mg Subcutaneous Q24H  . famotidine (PEPCID) IV  20 mg Intravenous Q12H  . loratadine  10 mg Oral Daily   And  . pseudoephedrine  120 mg Oral Q12H  . mouth rinse  15 mL Mouth Rinse q12n4p  . prednisoLONE acetate  1 drop Right Eye TID    Studies/Results: No results found.  Assessment: s/p Procedure(s): LAPAROSCOPIC PARTIAL COLECTOMY Patient Active Problem List   Diagnosis Date Noted  . Colon cancer ([place]) 01/14/2016    Acute blood loss anemia Post op ileus  Plan: Advance soft diet.  Ileus resolved Acute blood loss anemia on chronic blood loss anemia: stable after transfusion of 1  unit.  Will have Pt start PO Iron today  Tylenol for mild pain.  Tramadol otherwise SL IV Ambulate  D/c JP in AM May be able to d/c home tom   LOS: 5 days     .Rosario Adie, [facility], [place] [phone]   01/19/2016 9:28 AM

## 2016-01-19 NOTE — Progress Notes (Signed)
PHARMACIST - PHYSICIAN COMMUNICATION  DR:   Marcello Moores CONCERNING: IV to Oral Route Change Policy  RECOMMENDATION: This patient is receiving diphenhydramine by the intravenous route.  Based on criteria approved by the Pharmacy and Therapeutics Committee, intravenous diphenhydramine is being converted to the equivalent oral dose form(s).   DESCRIPTION: These criteria include:  Diphenhydramine is not prescribed to treat or prevent a severe allergic reaction  Diphenhydramine is not prescribed as premedication prior to receiving blood product, biologic medication, antimicrobial, or chemotherapy agent  The patient has tolerated at least one dose of an oral or enteral medication  The patient has no evidence of active gastrointestinal bleeding or impaired GI absorption (gastrectomy, short bowel, patient on TNA or NPO).  The patient is not undergoing procedural sedation   If you have questions about this conversion, please contact the Pharmacy Department  []   [phone] )  [facility] []   [phone] )  [facility] []   [phone] )  [facility] []   [phone] )  [facility] [x]   [phone] )  [place], PharmD, BCPS 01/19/2016 9:36 AM

## 2016-01-20 MED ORDER — TRAMADOL HCL 50 MG PO TABS: 50.0000 mg | ORAL_TABLET | Freq: Four times a day (QID) | ORAL | 0 refills | Status: DC | PRN

## 2016-01-20 MED ORDER — FERROUS SULFATE 325 (65 FE) MG PO TABS: 325.0000 mg | ORAL_TABLET | Freq: Three times a day (TID) | ORAL | Status: DC

## 2016-01-20 NOTE — Progress Notes (Signed)
01/20/16  1245  Reviewed discharge instructions with patient and wife. Patient verbalized understanding of discharge instructions. Copy of discharge instructions and prescription given to patient.

## 2016-01-20 NOTE — Discharge Instructions (Signed)

## 2016-01-20 NOTE — Discharge Summary (Signed)
Physician Discharge Summary  Patient ID: Todd French MRN: [id number] DOB/AGE: 1936-01-07 80 y.o.  Admit date: 01/14/2016 Discharge date: 01/20/2016  Admission Diagnoses: Colon cancer  Discharge Diagnoses:  Active Problems:   Colon cancer ([place]) Stage 3  Discharged Condition: good  Hospital Course: Pt admitted after surgery.  He was noted to have acute blood loss anemia and was given 1 u pRBC's.  He responded appropriately.  He developed a post op ileus.  His diet was advanced once this resolved.  By POD 7 he was tolerating a diet and PO pain meds.  He was felt to be in stable condition for discharge to home.  Consults: None  Significant Diagnostic Studies: labs: cbc, chemistry  Treatments: IV hydration, analgesia: tramadol and surgery: lap assisted R colectomy  Discharge Exam: Blood pressure 114/62, pulse 90, temperature 98 F (36.7 C), temperature source Oral, resp. rate 16, height 5' 5.5" (1.664 m), weight 69.4 kg (153 lb), SpO2 98 %. General appearance: alert and cooperative GI: soft, non-distended Incision/Wound: clean, dry, intact  Disposition: home     Medication List    TAKE these medications   aspirin EC 81 MG tablet Take 81 mg by mouth daily.   atorvastatin 10 MG tablet Commonly known as:  LIPITOR Take 10 mg by mouth daily.   CORICIDIN HBP FLU PO Take by mouth 2 (two) times daily.   cycloSPORINE 0.05 % ophthalmic emulsion Commonly known as:  RESTASIS Place 1 drop into both eyes daily.   enalapril-hydrochlorothiazide 10-25 MG tablet Commonly known as:  VASERETIC Take 1 tablet by mouth daily.   ferrous sulfate 325 (65 FE) MG tablet Take 1 tablet (325 mg total) by mouth 3 (three) times daily with meals.   loratadine-pseudoephedrine 10-240 MG 24 hr tablet Commonly known as:  CLARITIN-D 24-hour Take 1 tablet by mouth daily.   NONFORMULARY OR COMPOUNDED ITEM Colon Clenz 25/425-Take one capsule daily   prednisoLONE acetate 1 % ophthalmic  suspension Commonly known as:  PRED FORTE Place 1 drop into the right eye 3 (three) times daily. 3 x day for 2 weeks , then bid then done   ranitidine 150 MG tablet Commonly known as:  ZANTAC Take 150 mg by mouth daily as needed for heartburn.   traMADol 50 MG tablet Commonly known as:  ULTRAM Take 1-2 tablets (50-100 mg total) by mouth every 6 (six) hours as needed for moderate pain or severe pain.      Follow-up Information    Rosario Adie., MD. Schedule an appointment as soon as possible for a visit in 2 week(s).   Specialty:  General Surgery Contact information: [address] [place] [address] [phone]           Signed: Rosario Adie [id number], 8:48 AM

## 2016-02-04 ENCOUNTER — Telehealth: Payer: Self-pay | Admitting: Oncology

## 2016-02-04 ENCOUNTER — Encounter: Payer: Self-pay | Admitting: *Deleted

## 2016-02-04 ENCOUNTER — Ambulatory Visit (HOSPITAL_BASED_OUTPATIENT_CLINIC_OR_DEPARTMENT_OTHER): Payer: Federal, State, Local not specified - PPO | Admitting: Oncology

## 2016-02-04 VITALS — BP 118/62 | HR 93 | Temp 98.6°F | Resp 18 | Ht 65.5 in | Wt 143.7 lb

## 2016-02-04 DIAGNOSIS — Z23 Encounter for immunization: Secondary | ICD-10-CM

## 2016-02-04 DIAGNOSIS — D509 Iron deficiency anemia, unspecified: Secondary | ICD-10-CM

## 2016-02-04 DIAGNOSIS — C182 Malignant neoplasm of ascending colon: Secondary | ICD-10-CM

## 2016-02-04 DIAGNOSIS — C7989 Secondary malignant neoplasm of other specified sites: Secondary | ICD-10-CM

## 2016-02-04 DIAGNOSIS — C779 Secondary and unspecified malignant neoplasm of lymph node, unspecified: Secondary | ICD-10-CM | POA: Diagnosis not present

## 2016-02-04 DIAGNOSIS — C183 Malignant neoplasm of hepatic flexure: Secondary | ICD-10-CM | POA: Diagnosis not present

## 2016-02-04 MED ORDER — INFLUENZA VAC SPLIT QUAD 0.5 ML IM SUSY
0.5000 mL | PREFILLED_SYRINGE | Freq: Once | INTRAMUSCULAR | Status: AC
  Administered 2016-02-04: 0.5 mL via INTRAMUSCULAR
  Filled 2016-02-04: qty 0.5

## 2016-02-04 MED ORDER — CAPECITABINE 500 MG PO TABS: 1500.0000 mg | ORAL_TABLET | Freq: Two times a day (BID) | ORAL | 1 refills | Status: DC

## 2016-02-04 NOTE — Patient Instructions (Signed)
Care Plan Summary- 02/04/2016 Name: Todd French       DOB: 1935/07/06 Your Medical Team:  Medical Oncologist:  Dr. Ma Rings Radiation Oncologist:    Surgeon:   Dr. Leighton Ruff Type of Cancer: Adenocarcinoma Colon  Stage/Grade: Stage III *Exact staging of your cancer is based on size of the tumor, depth of invasion, involvement of lymph nodes or not, and whether or not the cancer has spread beyond the primary site.   Recommendations: Based on information available as of today's consult. Recommendations may change depending on the results of further tests or exams. 1) Chemotherapy with oral Xeloda: twice daily X 14 days, then 7 rest (1 cycle) for total of 8 cycles       2)  Full colonoscopy 1 year after surgery and if OK, can check in 3 years       3) Your children are at increased risk of colon cancer and need colonoscopy by age 63. _______________________________________________________________________ Next Steps:  1) Chemo education class 2) Return to Dr. Benay Spice in 3 weeks with lab 3) Script will be sent to [facility] & you will be called when ready. Questions? Todd Elks, RN, BSN at [phone]. Todd French is your Oncology Nurse Navigator and is available to assist you while you're receiving your [place].

## 2016-02-04 NOTE — Progress Notes (Signed)
[place] Patient Consult   Referring MD: Art Levan 80 y.o.  26-May-1935    Reason for Referral: Colon cancer   HPI: Mr. Botting has a history of colon polyps. He was referred to Dr. Hilarie Fredrickson for a colonoscopy on 12/17/2015. A fungating mass was found at the hepatic flexure. The mass could not be traversed. The lesion was biopsied tattooed. Polyps were removed from the descending and transverse colon. The pathology from the hepatic flexure mass revealed adenocarcinoma. Polyps from the descending and transverse colon returned as tubular adenomas. He was referred for CTs of the chest, abdomen, and pelvis on 12/20/2015. A 4 mm subpleural nodule was noted in left lower lobe. No additional nodules. No focal hepatic lesion. Irregular thickening is noted at the hepatic flexure without a mass lesion. No lymph nodes in the right colon mesentery.  He was referred to Dr. Marcello Moores and was taken the operating room for a laparoscopic right colectomy on 01/14/2016. The liver appeared normal. No evidence of metastatic disease. 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 Tumor extends into pericolonic connective tissue. The resection margins are negative. One of 21 lymph nodes contained metastatic carcinoma. The tumor was moderately differentiated. Lymphovascular and perineural invasion are present. The tumor returned microsatellite instability-high with loss of MLH1 and PMS2 expression. No BRAF patient. MLH1 Hypermethylation is present.  His bowels are moving. He reports losing 15 pound surrounding surgery and has a good appetite.  Past Medical History:  Diagnosis Date  . Allergy    a lot drainage  . Arthritis    hands and back      . Cancer ([place]) 12/16/2015   colon -Hepatic flexure   . Cataract    Bil  . Complication of anesthesia    'LITTLE HARD TO WAKE UP AT TIMES "  . Constipation    on and off  .  Diverticulitis   . GERD (gastroesophageal reflux disease)   . Hemorrhoids   . Hyperlipidemia   . Hypertension   . Myocardial infarction 09/1989   Angi[facility] at [facility]  . Pre-diabetes     Past Surgical History:  Procedure Laterality Date  . bunion foot surgery Left   . CATARACT EXTRACTION  06/2013   Bil  . COLONOSCOPY     x 3  . CORONARY ANGIOPLASTY  1991  . EYE SURGERY Bilateral 2015   ioc for catract  . LAPAROSCOPIC PARTIAL COLECTOMY N/A 01/14/2016   Procedure: LAPAROSCOPIC PARTIAL COLECTOMY;  Surgeon: Leighton Ruff, MD;  Location: [facility]S;  Service: General;  Laterality: N/A;  . RETINAL DETACHMENT SURGERY  06/2015   right eye  . ROTATOR CUFF REPAIR Right 1997   right shoulder  . surgery for retached retina Right 06/2015    Medications: Reviewed  Allergies: No Known Allergies  Family history: He is an only child. He has 2 children. No family history of cancer.  Social History:   He lives with his wife in [place]. He is retired from the [employer]. He quit smoking cigarettes in 1976. He reports moderate alcohol use. No risk factor for sexual transmitted diseases. He was transfused with red blood cells 01/15/2016.  History  Alcohol Use  . 4.2 oz/week  . 7 Glasses of wine per week    Comment: OCC    History  Smoking Status  . Former Smoker  . Types: Cigarettes  . Quit date: 12/03/1974  Smokeless Tobacco  . Never Used  ROS:   Positives include:15 pound weight loss, constipation prior to surgery, year round "allergies "with sinus drainage, intermittent dizziness when standing up  A complete ROS was otherwise negative.  Physical Exam:  Blood pressure 118/62, pulse 93, temperature 98.6 F (37 C), temperature source Oral, resp. rate 18, height 5' 5.5" (1.664 m), weight 143 lb 11.2 oz (65.2 kg), SpO2 100 %.  HEENT: Upper denture plate, oral cavity without visible mass-I could not visualize the pharynx. Neck without mass.  Lungs: Clear bilaterally    Cardiac: Regular rate and rhythm  Abdomen: No hepatosplenomegaly, no mass, healed surgical incision GU: Uncircumcised male, testes without mass  Vascular: No leg edema  Lymph nodes: No cervical, supra-clavicular, axillary, or inguinal nodes  Neurologic: Alert and oriented, the motor exam appears intact in the upper and lower extremities  Skin: No rash  Musculoskeletal: No spine tenderness  LAB:  CBC  Lab Results  Component Value Date   WBC 10.9 (H) 01/18/2016   HGB 7.7 (L) 01/18/2016   HCT 24.1 (L) 01/18/2016   MCV 80.1 01/18/2016   PLT 312 01/18/2016   NEUTROABS 6.8 12/17/2015    01/29/2016-[place]: Heme of 10.5, MCV 83.1, platelets 564,000, white count 13.11, ANC 7.87 CMP      Component Value Date/Time   NA 135 01/17/2016 0425   K 3.8 01/17/2016 0425   CL 105 01/17/2016 0425   CO2 24 01/17/2016 0425   GLUCOSE 140 (H) 01/17/2016 0425   BUN 7 01/17/2016 0425   CREATININE 0.66 01/17/2016 0425   CALCIUM 8.3 (L) 01/17/2016 0425   PROT 7.1 12/17/2015 1538   ALBUMIN 4.1 12/17/2015 1538   AST 14 12/17/2015 1538   ALT 10 12/17/2015 1538   ALKPHOS 105 12/17/2015 1538   BILITOT 0.5 12/17/2015 1538   GFRNONAA >60 01/17/2016 0425   GFRAA >60 01/17/2016 0425   01/08/2016: CEA-2.5   12/20/2017[place]: Creatinine 0.95, AST 34, ALT 24, alk phosphatase 127, ferritin 34.1   Imaging: As per history of present illness, CT images from 12/20/2015-reviewed   Assessment/Plan:   1. 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   History of colonic polyps  3.   Iron deficiency anemia  4.   History of coronary artery disease   Disposition:   Mr. Endicott has  been diagnosed with stage III colon cancer. I reviewed the details of the surgical pathology report with Mr. Peyton and his wife. We discussed the prognosis and adjuvant treatment options. He has a significant chance of developing recurrent colon cancer over the next several years. I recommend adjuvant capecitabine. We reviewed the potential toxicities associated with capecitabine including the chance for mucositis, diarrhea, hematologic toxicity, rash, hyperpigmentation, sun sensitivity, and the hand/foot syndrome. He agrees to proceed with a course of adjuvant capecitabine. He will attend a chemotherapy teaching class. A prescription for capecitabine was submitted to the pharmacy today.  He does not appear to have hereditary non-polyposis colon cancer syndrome. His children should undergo screening colonoscopies.  Mr. Pecha will begin a first cycle of adjuvant capecitabine on 02/10/2016. He will return for an office and lab visit on 02/26/2016.  Approximately 50 minutes were spent with the patient today. The majority of the time was used for counseling and  coordination of care.   Betsy Coder, MD  02/04/2016, 2:25 PM

## 2016-02-04 NOTE — Patient Instructions (Signed)
Care Plan Summary- 02/04/2016 Name: Todd French       DOB: [birth year] Your Medical Team:  Medical Oncologist:  Dr. Ma Rings Radiation Oncologist:    Surgeon:   Dr. Leighton Ruff Type of Cancer: Adenocarcinoma Colon  Stage/Grade: Stage III *Exact staging of your cancer is based on size of the tumor, depth of invasion, involvement of lymph nodes or not, and whether or not the cancer has spread beyond the primary site.   Recommendations: Based on information available as of today's consult. Recommendations may change depending on the results of further tests or exams. 1) Chemotherapy with oral Xeloda: twice daily X 14 days, then 7 rest (1 cycle) for total of 8 cycles       2)  Full colonoscopy 1 year after surgery and if OK, can check in 3 years       3) Your children are at increased risk of colon cancer and need colonoscopy by age 66. _______________________________________________________________________ Next Steps:  1) Chemo education class 2) Return to Dr. Benay Spice in 3 weeks with lab 3) Script will be sent to [facility] & you will be called when ready. Questions? Merceda Elks, RN, BSN at [phone]. Manuela Schwartz is your Oncology Nurse Navigator and is available to assist you while you're receiving your [place].

## 2016-02-04 NOTE — Telephone Encounter (Signed)
Chemo Education scheduled for 02/06/16, per 02/04/16 los. Lab and follow up appointments scheduled per 02/04/16 los. A copy of the AVS report and appointment schedule was given to the patient per 02/04/16 los.

## 2016-02-04 NOTE — Progress Notes (Signed)
Oncology Nurse Navigator Documentation  Oncology Nurse Navigator Flowsheets 02/04/2016  Navigator Location [facility]  Referral date to RadOnc/MedOnc 12/19/2015  Navigator Encounter Type Initial MedOnc  Abnormal Finding Date 12/17/2015  Confirmed Diagnosis Date 12/17/2015  Surgery Date 01/14/2016  Patient Visit Type MedOnc;Initial  Treatment Phase Pre-Tx/Tx Discussion  Barriers/Navigation Needs Education  Education Understanding Cancer/ Treatment Options;Pain/ Symptom Management;Newly Diagnosed Cancer Education;Preparing for Upcoming Treatment  Interventions Education  Education Method Verbal;Written;Teach-back  Support Groups/Services GI Support Group;[place];Other--[place] Support Services  Acuity Level 2  Time Spent with Patient 60  Met with patient and wife, Letta Median during new patient visit. Explained the role of the GI Nurse Navigator and provided New Patient Packet with information on: 1. Colon cancer: information on CEA level, anatomy of colon and fact sheet on Xeloda 2. Support groups 3. Advanced Directives 4. Fall Safety Plan Answered questions, reviewed current treatment plan using TEACH back and provided emotional support. Provided copy of current treatment plan. Mr. Hogston is an only child and has two grown children, Manuela Schwartz and Linus Orn. He and his wife have been married over 76 years. He is a retired Tour manager and enjoys being outdoors. He has a very active lifestyle. He is anxious to get started on his chemotherapy. He will be scheduled for a chemo education class later this week. The live in [place], but prefer to have physicians in [place].  Merceda Elks, RN, BSN GI Oncology [place]

## 2016-02-05 ENCOUNTER — Encounter: Payer: Self-pay | Admitting: Pharmacist

## 2016-02-05 NOTE — Progress Notes (Signed)
Oral Chemotherapy Pharmacist Encounter Received new Rx for Xeloda 02/04/16. Planned start of 02/10/16. Reviewed Rx for [facility]DI with current medication listed within [facility], labs, and dosing. Xeloda has a moderate interaction with Enalopril/HCTZ, specifcally the HCTZ component. Concurrent use may result in myeolosuppression. It is recommended that if concurrent use is required the patient be monitored with periodic CBC's.   The prescriptions have been sent to [facility] for benefit analysis and approval.   Thank you,  Nuala Alpha, PharmD, [facility] [phone]

## 2016-02-06 ENCOUNTER — Telehealth: Payer: Self-pay | Admitting: *Deleted

## 2016-02-06 ENCOUNTER — Telehealth: Payer: Self-pay | Admitting: Pharmacist

## 2016-02-06 ENCOUNTER — Other Ambulatory Visit: Payer: Self-pay | Admitting: *Deleted

## 2016-02-06 ENCOUNTER — Other Ambulatory Visit: Payer: Federal, State, Local not specified - PPO

## 2016-02-06 DIAGNOSIS — C182 Malignant neoplasm of ascending colon: Secondary | ICD-10-CM

## 2016-02-06 DIAGNOSIS — C183 Malignant neoplasm of hepatic flexure: Secondary | ICD-10-CM

## 2016-02-06 MED ORDER — PROCHLORPERAZINE MALEATE 5 MG PO TABS: 5.0000 mg | ORAL_TABLET | Freq: Four times a day (QID) | ORAL | 0 refills | Status: DC | PRN

## 2016-02-06 MED ORDER — CAPECITABINE 500 MG PO TABS: 1500.0000 mg | ORAL_TABLET | Freq: Two times a day (BID) | ORAL | 1 refills | Status: DC

## 2016-02-06 NOTE — Telephone Encounter (Signed)
Oral Chemotherapy Pharmacist Encounter  Received notification from Merceda Elks, RN, GI nurse navigator that patient's Xeloda copay from [facility] [place] would be ~$500. Noted patient has federal [employer].  [facility] [place], [place] in hopes of decreased copayment. Xeloda prescription e-scribed by Rosalio Macadamia, RN. I faxed demographic and clinical info to [phone].  Oral Oncology Clinic will continue to follow.  Johny Drilling, PharmD, BCPS, BCOP 02/06/2016  10:16 AM Oral Oncology Clinic [phone]

## 2016-02-06 NOTE — Telephone Encounter (Signed)
Oncology Nurse Navigator Documentation  Oncology Nurse Navigator Flowsheets 02/06/2016  Navigator Location [facility]  Referral date to RadOnc/MedOnc -  Navigator Encounter Type Telephone  Telephone Outgoing Call  Abnormal Finding Date -  Confirmed Diagnosis Date -  Surgery Date -  Patient Visit Type -  Treatment Phase -  Barriers/Navigation Needs Coordination of Care--Xeloda copay at [place] is $500.  Education -  Interventions Coordination of Care--message to oral chemo pharmacist, Margy Clarks to follow up   Coordination of Care Other--Xeloda script. Sent antiemetic to his pharmacy for prn use.  Education Method -  Support Groups/Services -  Acuity -  Time Spent with Patient 15

## 2016-02-06 NOTE — Telephone Encounter (Signed)
Oral Chemotherapy Pharmacist Encounter  [facility] to ensure they had received Xeloda prescription and clinical information. Prescription receipt confirmed. They noted patient does not have Medicare Part B.   Patient  does have federal insurance so [facility] that handles federal employee claims in hopes of a lower copay. Rx not yet forwarded to them, but it will be done today.  The office can call [phone], option for Oncology to follow-up on prescription status.  Oral Oncology Clinic will continue to follow.  Johny Drilling, PharmD, BCPS, BCOP 02/06/2016  3:44 PM Oral Oncology Clinic [phone]

## 2016-02-07 ENCOUNTER — Telehealth: Payer: Self-pay | Admitting: *Deleted

## 2016-02-07 NOTE — Telephone Encounter (Signed)
Oncology Nurse Navigator Documentation  Oncology Nurse Navigator Flowsheets 02/07/2016  Navigator Location [facility]  Referral date to RadOnc/MedOnc -  Navigator Encounter Type Telephone  Telephone Outgoing Call;Medication Assistance  Abnormal Finding Date -  Confirmed Diagnosis Date -  Surgery Date -  Patient Visit Type -  Treatment Phase -  Barriers/Navigation Needs Coordination of Care--Xeloda and f/u  Education -  Interventions Coordination of Care--[facility] and confirmed [facility]ay of $45 and they will be calling patient to schedule delivery. They do not ship on weekends or holidays, so he would get med on 1/4 at the earliest.  Coordination of Care Appts;Chemo--rescheduled office visit since chemo start date will not be until 02/13/16 at the earliest according to [facility]  Education Method Verbal;Teach-back  Support Groups/Services -  Acuity -  Time Spent with Patient 101  Gave Todd French the telephone #[phone] to [facility] arrange for delivery of his Xeloda. He will call today. He is pleased with the copay.

## 2016-02-07 NOTE — Telephone Encounter (Signed)
Oral Chemotherapy Pharmacist Encounter  Noted Todd French has [facility] about [facility] prescription sent in on 02/06/16. Noted copayment of $45 and that the pharmacy plans to schedule delivery with the patient.  Oral Oncology Clinic will continue to follow for start date, initial counseling, toxicity and adherence management.  Johny Drilling, PharmD, BCPS, BCOP 02/07/2016  12:03 PM Oral Oncology Clinic [phone]

## 2016-02-14 ENCOUNTER — Telehealth: Payer: Self-pay | Admitting: Pharmacist

## 2016-02-14 ENCOUNTER — Telehealth: Payer: Self-pay | Admitting: *Deleted

## 2016-02-14 NOTE — Telephone Encounter (Signed)
Oral Chemotherapy Pharmacist Encounter I spoke with patient for overview of new oral chemotherapy medication: Xeloda. Pt is doing well. He reports starting his medication on 02/13/16 and having no issues so far.  Counseled patient on administration, dosing, side effects, safe handling, and monitoring. Side effects include but not limited to: nausea, diarrhea, abdominal pain, fatigue, hand-foot syndrome.  He voiced understanding and appreciation.   He also asked about how much time he should have between his two daily doses. Told him it was best to space out these doses with breakfast and dinner so they are not too close together. Asked the patient how he is going to keep track of his dosing schedule and he said he was filling out his calendar to keep track of his off week.  All questions answered.  Provided the patient with Oral Chemo Clinic number and hours. Instructed him to call if he had any questions or issues related to his Xeloda.   Thank you,  Nuala Alpha, PharmD, [place] Oral Chemotherapy Clinic [phone]

## 2016-02-14 NOTE — Telephone Encounter (Signed)
Oncology Nurse Navigator Documentation  Oncology Nurse Navigator Flowsheets 02/14/2016  Navigator Location [facility]  Referral date to RadOnc/MedOnc -  Navigator Encounter Type Telephone  Telephone Incoming Call;Patient Update--asking if he should have labs done sooner than 1/25. Cardiologist was concerned his Hgb was 10.0 on 10/20. Made her aware that it was 7.7 on 12/9, so 10.0 is better than last lab in [facility] system. Informed her to let cardiologist know, but ultimately follow his advice on what labs he needs.  Abnormal Finding Date -  Confirmed Diagnosis Date -  Surgery Date -  Treatment Initiated Date 02/13/2016  Patient Visit Type -  Treatment Phase Active Tx--Xeloda started 02/13/16  Barriers/Navigation Needs Education  Education Other--suggested he try taking am dose sooner that 10:00 if he is taking pm dose at 6:00 pm.   Interventions Education  Coordination of Care -  Education Method Verbal  Support Groups/Services -  Acuity -  Time Spent with Patient 15

## 2016-02-26 ENCOUNTER — Ambulatory Visit: Payer: Federal, State, Local not specified - PPO | Admitting: Oncology

## 2016-02-26 ENCOUNTER — Other Ambulatory Visit: Payer: Federal, State, Local not specified - PPO

## 2016-02-28 ENCOUNTER — Telehealth: Payer: Self-pay | Admitting: *Deleted

## 2016-02-28 DIAGNOSIS — C182 Malignant neoplasm of ascending colon: Secondary | ICD-10-CM

## 2016-02-28 MED ORDER — CAPECITABINE 500 MG PO TABS: 1500.0000 mg | ORAL_TABLET | Freq: Two times a day (BID) | ORAL | 0 refills | Status: DC

## 2016-02-28 NOTE — Telephone Encounter (Signed)
"  We're having problems with the chemotherapy pills for colon cancer (Xeloda).  He needs a new order sent.  [facility] has changed to [facility].  He's on his week off and needs to restart 03-04-2016.  Do we start the Xeloda on 03-04-2016 or wait until he see's Dr. Benay Spice on 03-05-2016?  We have a 83 mile drive from [place].  Will be in [facility] Monday at 11:00 am to see Dr. Marcello Moores.  Could the appointment be changed to Monday morning or the afternoon?"

## 2016-02-28 NOTE — Telephone Encounter (Signed)
Spoke with pt and wife, he reports he received letter from [facility] stating specialty pharmacy has changed to [facility] for chemo pills.  Pt denies any side effects from Xeloda. Denies mucositis, diarrhea or hand/ foot pain. Informed him he will be due to start Xeloda on 1/25- bring medication to visit, will need to check labs before starting cycle. He voiced understanding.

## 2016-02-28 NOTE — Addendum Note (Signed)
Addended by: Brien Few on: 02/28/2016 04:37 PM   Modules accepted: Orders

## 2016-02-28 NOTE — Telephone Encounter (Signed)
Patient called to ask if office can call in his refill on Xeloda that is due to start on 02/24/16. Had had no adverse effect from this first cycle. He specialty pharmacy has changed to [facility] [phone]. Is concerned that if we wait to call it in on 1/25 it could take another week to process it. Informed him that navigator will forward this to collaborative nurse to refill for him.

## 2016-03-02 ENCOUNTER — Encounter: Payer: Self-pay | Admitting: *Deleted

## 2016-03-02 NOTE — Progress Notes (Signed)
Oncology Nurse Navigator Documentation  Oncology Nurse Navigator Flowsheets 03/02/2016  Navigator Location [facility]  Referral date to RadOnc/MedOnc -  Navigator Encounter Type Lobby  Telephone Medication Assistance--Xeloda  Abnormal Finding Date -  Confirmed Diagnosis Date -  Surgery Date -  Treatment Initiated Date -  Patient Visit Type Walk-in: came to f/u on changing pharmacy   Treatment Phase Active Tx  Barriers/Navigation Needs -  Education -  Interventions -  Coordination of Care -  Education Method Verbal--made him aware that Dr. Gearldine Shown nurse called in his script to new pharmacy on 02/28/16. Do not take med until after office visit on 1/25.  Support Groups/Services -  Acuity Level 1  Time Spent with Patient 15

## 2016-03-05 ENCOUNTER — Ambulatory Visit (HOSPITAL_BASED_OUTPATIENT_CLINIC_OR_DEPARTMENT_OTHER): Payer: Federal, State, Local not specified - PPO | Admitting: Oncology

## 2016-03-05 ENCOUNTER — Other Ambulatory Visit (HOSPITAL_BASED_OUTPATIENT_CLINIC_OR_DEPARTMENT_OTHER): Payer: Federal, State, Local not specified - PPO

## 2016-03-05 ENCOUNTER — Telehealth: Payer: Self-pay | Admitting: Oncology

## 2016-03-05 ENCOUNTER — Encounter: Payer: Self-pay | Admitting: *Deleted

## 2016-03-05 VITALS — BP 123/58 | HR 84 | Temp 97.7°F | Resp 18 | Ht 65.5 in | Wt 152.2 lb

## 2016-03-05 DIAGNOSIS — C779 Secondary and unspecified malignant neoplasm of lymph node, unspecified: Secondary | ICD-10-CM

## 2016-03-05 DIAGNOSIS — C183 Malignant neoplasm of hepatic flexure: Secondary | ICD-10-CM

## 2016-03-05 DIAGNOSIS — C182 Malignant neoplasm of ascending colon: Secondary | ICD-10-CM

## 2016-03-05 LAB — CBC WITH DIFFERENTIAL/PLATELET
BASO%: 0.5 % (ref 0.0–2.0)
Basophils Absolute: 0 10*3/uL (ref 0.0–0.1)
EOS ABS: 0.5 10*3/uL (ref 0.0–0.5)
EOS%: 4.8 % (ref 0.0–7.0)
HEMATOCRIT: 37.4 % — AB (ref 38.4–49.9)
HGB: 12.5 g/dL — ABNORMAL LOW (ref 13.0–17.1)
LYMPH#: 2.4 10*3/uL (ref 0.9–3.3)
LYMPH%: 24 % (ref 14.0–49.0)
MCH: 29.3 pg (ref 27.2–33.4)
MCHC: 33.4 g/dL (ref 32.0–36.0)
MCV: 87.7 fL (ref 79.3–98.0)
MONO#: 0.8 10*3/uL (ref 0.1–0.9)
MONO%: 8.6 % (ref 0.0–14.0)
NEUT%: 62.1 % (ref 39.0–75.0)
NEUTROS ABS: 6.1 10*3/uL (ref 1.5–6.5)
PLATELETS: 321 10*3/uL (ref 140–400)
RBC: 4.27 10*6/uL (ref 4.20–5.82)
RDW: 22.9 % — ABNORMAL HIGH (ref 11.0–14.6)
WBC: 9.8 10*3/uL (ref 4.0–10.3)

## 2016-03-05 NOTE — Progress Notes (Signed)
Oncology Nurse Navigator Documentation  Oncology Nurse Navigator Flowsheets 03/05/2016  Navigator Location [facility]  Referral date to RadOnc/MedOnc -  Navigator Encounter Type Follow-up Appt  Telephone -  Abnormal Finding Date -  Confirmed Diagnosis Date -  Surgery Date -  Treatment Initiated Date -  Patient Visit Type MedOnc  Treatment Phase Active Tx--Xeloda  Barriers/Navigation Needs Education  Education Accessing Care/ Finding Providers  Interventions Other--provided phone # of [place] that office called for his script.  Coordination of Care -  Education Method Written  Support Groups/Services -  Acuity -  Time Spent with Patient 15  Reassured him that it will have no impact on his survival if he starts his next cycle a few days late, as long as this does not happen w/each cycle. Instructed him to call triage for the refill the day he takes his last Xeloda in each cycle to allow time for refill to process.

## 2016-03-05 NOTE — Telephone Encounter (Signed)
Appointment scheduled per 03/05/16 los. Patient was given a copy of the AVS report and appointment schedule per 03/05/16 los. °

## 2016-03-05 NOTE — Progress Notes (Signed)
  [facility] OFFICE PROGRESS NOTE   Diagnosis: Colon cancer  INTERVAL HISTORY:   Todd French returns as scheduled. He began a first cycle of adjuvant Xeloda on 02/13/2016. No mouth sores or hand/foot pain. He had frequent soft bowel movements, but no diarrhea. No complaint.  Objective:  Vital signs in last 24 hours:  Blood pressure (!) 123/58, pulse 84, temperature 97.7 F (36.5 C), temperature source Oral, resp. rate 18, height 5' 5.5" (1.664 m), weight 152 lb 3.2 oz (69 kg), SpO2 100 %.    HEENT: No thrush or ulcers Resp: Lungs clear bilaterally Cardio: Regular rate and rhythm GI: No hepatomegaly, nontender  Vascular: No leg edema  Skin: Skin thickening, mild erythema, and hyperpigmentation at the hands. Dry desquamation with mild erythema at the soles     Lab Results:  Lab Results  Component Value Date   WBC 9.8 03/05/2016   HGB 12.5 (L) 03/05/2016   HCT 37.4 (L) 03/05/2016   MCV 87.7 03/05/2016   PLT 321 03/05/2016   NEUTROABS 6.1 03/05/2016     Medications: I have reviewed the patient's current medications.  Assessment/Plan: 1. 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   History of colonic polyps  3.   Iron deficiency anemia-Resolved  4.   History of coronary artery disease    Disposition:  Todd French tolerated the first cycle of Xeloda well. He will begin cycle 2 on 03/07/2016. He will contact us for hand or foot pain. He will return for an office and lab visit on 03/26/2016. The iron deficiency anemia has resolved. He will discontinue iron.  15 minutes were  spent with the patient today. The majority of the time was used for counseling and coordination of care.  Betsy Coder, MD  03/05/2016  10:07 AM

## 2016-03-18 ENCOUNTER — Telehealth: Payer: Self-pay | Admitting: *Deleted

## 2016-03-18 ENCOUNTER — Other Ambulatory Visit: Payer: Self-pay

## 2016-03-18 DIAGNOSIS — C182 Malignant neoplasm of ascending colon: Secondary | ICD-10-CM

## 2016-03-18 MED ORDER — CAPECITABINE 500 MG PO TABS: 1500.0000 mg | ORAL_TABLET | Freq: Two times a day (BID) | ORAL | 0 refills | Status: DC

## 2016-03-18 NOTE — Telephone Encounter (Signed)
Call received from patient's wife stating that pt needs a refill on Xeloda.

## 2016-03-24 ENCOUNTER — Ambulatory Visit (HOSPITAL_BASED_OUTPATIENT_CLINIC_OR_DEPARTMENT_OTHER): Payer: Medicare Other | Admitting: Nurse Practitioner

## 2016-03-24 ENCOUNTER — Other Ambulatory Visit (HOSPITAL_BASED_OUTPATIENT_CLINIC_OR_DEPARTMENT_OTHER): Payer: Federal, State, Local not specified - PPO

## 2016-03-24 ENCOUNTER — Telehealth: Payer: Self-pay | Admitting: Nurse Practitioner

## 2016-03-24 VITALS — BP 120/68 | HR 83 | Temp 97.7°F | Resp 18 | Ht 65.5 in | Wt 155.3 lb

## 2016-03-24 DIAGNOSIS — C183 Malignant neoplasm of hepatic flexure: Secondary | ICD-10-CM

## 2016-03-24 DIAGNOSIS — C182 Malignant neoplasm of ascending colon: Secondary | ICD-10-CM

## 2016-03-24 LAB — CBC WITH DIFFERENTIAL/PLATELET
BASO%: 0.5 % (ref 0.0–2.0)
Basophils Absolute: 0 10*3/uL (ref 0.0–0.1)
EOS%: 4.2 % (ref 0.0–7.0)
Eosinophils Absolute: 0.4 10*3/uL (ref 0.0–0.5)
HCT: 38.2 % — ABNORMAL LOW (ref 38.4–49.9)
HEMOGLOBIN: 12.8 g/dL — AB (ref 13.0–17.1)
LYMPH%: 26.6 % (ref 14.0–49.0)
MCH: 30.1 pg (ref 27.2–33.4)
MCHC: 33.4 g/dL (ref 32.0–36.0)
MCV: 90.2 fL (ref 79.3–98.0)
MONO#: 0.8 10*3/uL (ref 0.1–0.9)
MONO%: 7.3 % (ref 0.0–14.0)
NEUT%: 61.4 % (ref 39.0–75.0)
NEUTROS ABS: 6.4 10*3/uL (ref 1.5–6.5)
Platelets: 290 10*3/uL (ref 140–400)
RBC: 4.24 10*6/uL (ref 4.20–5.82)
RDW: 26.4 % — AB (ref 11.0–14.6)
WBC: 10.4 10*3/uL — AB (ref 4.0–10.3)
lymph#: 2.8 10*3/uL (ref 0.9–3.3)

## 2016-03-24 LAB — COMPREHENSIVE METABOLIC PANEL
ALBUMIN: 3.5 g/dL (ref 3.5–5.0)
ALK PHOS: 93 U/L (ref 40–150)
ALT: 15 U/L (ref 0–55)
AST: 16 U/L (ref 5–34)
Anion Gap: 8 mEq/L (ref 3–11)
BILIRUBIN TOTAL: 0.53 mg/dL (ref 0.20–1.20)
BUN: 13.6 mg/dL (ref 7.0–26.0)
CO2: 26 meq/L (ref 22–29)
CREATININE: 0.9 mg/dL (ref 0.7–1.3)
Calcium: 9.5 mg/dL (ref 8.4–10.4)
Chloride: 107 mEq/L (ref 98–109)
EGFR: 80 mL/min/{1.73_m2} — AB (ref >90)
GLUCOSE: 145 mg/dL — AB (ref 70–140)
Potassium: 3.9 mEq/L (ref 3.5–5.1)
SODIUM: 140 meq/L (ref 136–145)
TOTAL PROTEIN: 6.5 g/dL (ref 6.4–8.3)

## 2016-03-24 NOTE — Telephone Encounter (Signed)
Appointments scheduled per 2/13 LOS. Patient given AVS report and calendars with future scheduled appointments. °

## 2016-03-24 NOTE — Progress Notes (Signed)
  [place] OFFICE PROGRESS NOTE   Diagnosis:  Colon cancer  INTERVAL HISTORY:   Todd French returns as scheduled. He completed cycle 2 adjuvant Xeloda beginning 03/06/2016. He denies nausea/vomiting. He noted that his lips became "sore". He did not develop sores within the oral cavity. He was able to eat and drink without difficulty. He had a single loose stool on Sunday. He had 2 loose stools yesterday. He denies hand and foot pain. He notes a few "cracks" on his hands.  Objective:  Vital signs in last 24 hours:  Blood pressure 120/68, pulse 83, temperature 97.7 F (36.5 C), temperature source Oral, resp. rate 18, height 5' 5.5" (1.664 m), weight 155 lb 4.8 oz (70.4 kg), SpO2 100 %.    HEENT: No thrush or ulcers. Resp: Lungs clear bilaterally. Cardio: Regular rate and rhythm. GI: Abdomen soft and nontender. No hepatomegaly. Vascular: No leg edema. Skin: Palms with mild erythema. Several small linear breaks in the skin on both hands. Skin in general has a dry appearance.   Lab Results:  Lab Results  Component Value Date   WBC 10.4 (H) 03/24/2016   HGB 12.8 (L) 03/24/2016   HCT 38.2 (L) 03/24/2016   MCV 90.2 03/24/2016   PLT 290 03/24/2016   NEUTROABS 6.4 03/24/2016    Imaging:  No results found.  Medications: I have reviewed the patient's current medications.  Assessment/Plan: 1. 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 History of colonic polyps  3. Iron deficiency anemia-Resolved  4. History of coronary artery disease  5.    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   Disposition: Todd French appears stable. He has completed 2 cycles of adjuvant Xeloda. Plan to proceed with cycle 3 beginning 03/27/2016. He has developed mild hand-foot syndrome related to Xeloda. He understands to contact the office with hand/foot pain or skin breakdown. He will return for a follow-up visit in 3 weeks. He will contact the office in the interim as outlined above or with any other problems.  Plan reviewed with Dr. Benay Spice.    Ned Card ANP/GNP-BC   03/24/2016  9:31 AM

## 2016-04-03 ENCOUNTER — Telehealth: Payer: Self-pay | Admitting: *Deleted

## 2016-04-03 NOTE — Telephone Encounter (Signed)
Wife Letta Median called reporting re:  Pt is taking Xeloda 1500 mg BID for 14 days on, off 7 days.  Pt is currently on second week of Xeloda - took dose this am.  Cycle will complete on  Thursday  04/09/16.  Stated pt c/o Lips swollen today, and very red " like red lipstick ".  Has a red spot on corner of Right side of lip, very painful.  Pain was severe enough that pt stopped brushing teeth this am due to burning sensation .  Denied redness inside of mouth and no swelling.  Stated pt can still eat and drink fine.  Pt is drinking lots of fluids.  Dr. Benay Spice notified. Faye's   Cell   Phone    [phone].

## 2016-04-03 NOTE — Telephone Encounter (Signed)
Called pt back and spoke with wife Letta Median.  Instructed Faye to have pt STOP Xeloda - starting this PM dose and through the weekend.  Instructed Letta Median to call office back early Monday 04/06/16 to give update on pt's status, and for further instructions from Dr. Benay Spice.  Recommended pt to use bag balm to help with the redness.  Letta Median voiced understanding.

## 2016-04-06 ENCOUNTER — Telehealth: Payer: Self-pay | Admitting: *Deleted

## 2016-04-06 NOTE — Telephone Encounter (Signed)
Call placed back to patient's wife to inform her per order of Dr. Benay Spice to continue to hold Xeloda and to come in for appt on Weds, 04/08/16 at 9:15AM to see L. Marcello Moores NP. Patient's wife appreciative of call back and will be here on 04/08/16 for appt with L. Marcello Moores NP.

## 2016-04-06 NOTE — Telephone Encounter (Signed)
Call placed back to patient's wife to check on status of mouth sores.  Patient's wife states per patient that mouth sores are unchanged.  She states that his tongue is red, corners of mouth and inside of mouth are sore, no difficulties swallowing and that pt is using Bag Balm as ordered on Friday.  Will notify Dr. Benay Spice of update and call patient's wife back with MD instructions.

## 2016-04-06 NOTE — Telephone Encounter (Signed)
Message received from patient's wife inquiring when to start Xeloda back.

## 2016-04-08 ENCOUNTER — Ambulatory Visit (HOSPITAL_BASED_OUTPATIENT_CLINIC_OR_DEPARTMENT_OTHER): Payer: Medicare Other | Admitting: Nurse Practitioner

## 2016-04-08 VITALS — BP 116/82 | HR 86 | Temp 98.0°F | Resp 18 | Ht 65.5 in | Wt 157.8 lb

## 2016-04-08 DIAGNOSIS — C183 Malignant neoplasm of hepatic flexure: Secondary | ICD-10-CM

## 2016-04-08 DIAGNOSIS — I2510 Atherosclerotic heart disease of native coronary artery without angina pectoris: Secondary | ICD-10-CM | POA: Diagnosis not present

## 2016-04-08 NOTE — Progress Notes (Signed)
  [place] OFFICE PROGRESS NOTE   Diagnosis:  Colon cancer  INTERVAL HISTORY:   Mr. Mcilhenny returns as scheduled. He began cycle 3 adjuvant Xeloda 03/27/2016. He contacted the office on 04/03/2016 to report lip swelling, redness and pain. Xeloda was placed on hold.  Lips continue to be sore but improved. He has noted an area of significant pain along the left tongue. He is able to eat and drink. No nausea or vomiting. No diarrhea. He notes palms are red. No associated pain. No problem over the soles.  Objective:  Vital signs in last 24 hours:  Blood pressure 116/82, pulse 86, temperature 98 F (36.7 C), temperature source Oral, resp. rate 18, height 5' 5.5" (1.664 m), weight 157 lb 12.8 oz (71.6 kg), SpO2 98 %.    HEENT: Large ulceration left lateral tongue. Healing ulceration right lower lip. Resp: Lungs clear bilaterally. Cardio: Regular rate and rhythm. GI: Abdomen soft and nontender. No hepatomegaly. Vascular: No leg edema. Skin: Palms with mild erythema, skin thickening. No skin breakdown. Soles with mild erythema, dryness. Erythematous rash over the forearms.    Lab Results:  Lab Results  Component Value Date   WBC 10.4 (H) 03/24/2016   HGB 12.8 (L) 03/24/2016   HCT 38.2 (L) 03/24/2016   MCV 90.2 03/24/2016   PLT 290 03/24/2016   NEUTROABS 6.4 03/24/2016    Imaging:  No results found.  Medications: I have reviewed the patient's current medications.  Assessment/Plan: 1. 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 History of colonic polyps  3. Iron deficiency anemia-Resolved  4. History of coronary artery disease  5.   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   Disposition: Mr. Zimmers developed mucositis during cycle 3 Xeloda. He continues to have evidence of mucositis on exam today. He also has mild hand-foot syndrome. He will continue to hold Xeloda. He will return as scheduled on 04/14/2016. If the mucositis has resolved the plan will be to resume Xeloda at a reduced dose.  Plan reviewed with Dr. Benay Spice.    Ned Card ANP/GNP-BC   04/08/2016  9:46 AM

## 2016-04-13 ENCOUNTER — Encounter: Payer: Self-pay | Admitting: *Deleted

## 2016-04-13 ENCOUNTER — Telehealth: Payer: Self-pay | Admitting: *Deleted

## 2016-04-13 NOTE — Telephone Encounter (Signed)
rec'd call from chris at [facility]  Rx, requesting refill script for xeloda. Per Ned Card' last note, xeloda is on hold d/t advers reactions. patient is to be seen on 04/14/16 and may be resumed at a lower dose at that time.

## 2016-04-14 ENCOUNTER — Other Ambulatory Visit (HOSPITAL_BASED_OUTPATIENT_CLINIC_OR_DEPARTMENT_OTHER): Payer: Federal, State, Local not specified - PPO

## 2016-04-14 ENCOUNTER — Telehealth: Payer: Self-pay | Admitting: Oncology

## 2016-04-14 ENCOUNTER — Ambulatory Visit (HOSPITAL_BASED_OUTPATIENT_CLINIC_OR_DEPARTMENT_OTHER): Payer: Medicare Other | Admitting: Oncology

## 2016-04-14 VITALS — BP 117/71 | HR 85 | Temp 98.0°F | Resp 18 | Ht 65.5 in | Wt 157.8 lb

## 2016-04-14 DIAGNOSIS — C183 Malignant neoplasm of hepatic flexure: Secondary | ICD-10-CM | POA: Diagnosis present

## 2016-04-14 DIAGNOSIS — K1239 Other oral mucositis (ulcerative): Secondary | ICD-10-CM | POA: Diagnosis not present

## 2016-04-14 LAB — CBC WITH DIFFERENTIAL/PLATELET
BASO%: 0.7 % (ref 0.0–2.0)
Basophils Absolute: 0.1 10*3/uL (ref 0.0–0.1)
EOS ABS: 0.6 10*3/uL — AB (ref 0.0–0.5)
EOS%: 5 % (ref 0.0–7.0)
HEMATOCRIT: 39.6 % (ref 38.4–49.9)
HGB: 13.3 g/dL (ref 13.0–17.1)
LYMPH#: 2.8 10*3/uL (ref 0.9–3.3)
LYMPH%: 21.9 % (ref 14.0–49.0)
MCH: 30 pg (ref 27.2–33.4)
MCHC: 33.6 g/dL (ref 32.0–36.0)
MCV: 89.2 fL (ref 79.3–98.0)
MONO#: 1 10*3/uL — AB (ref 0.1–0.9)
MONO%: 7.6 % (ref 0.0–14.0)
NEUT#: 8.3 10*3/uL — ABNORMAL HIGH (ref 1.5–6.5)
NEUT%: 64.8 % (ref 39.0–75.0)
Platelets: 259 10*3/uL (ref 140–400)
RBC: 4.44 10*6/uL (ref 4.20–5.82)
RDW: 24.3 % — ABNORMAL HIGH (ref 11.0–14.6)
WBC: 12.8 10*3/uL — ABNORMAL HIGH (ref 4.0–10.3)

## 2016-04-14 LAB — COMPREHENSIVE METABOLIC PANEL
ALT: 25 U/L (ref 0–55)
AST: 22 U/L (ref 5–34)
Albumin: 3.6 g/dL (ref 3.5–5.0)
Alkaline Phosphatase: 97 U/L (ref 40–150)
Anion Gap: 7 mEq/L (ref 3–11)
BILIRUBIN TOTAL: 0.55 mg/dL (ref 0.20–1.20)
BUN: 13.3 mg/dL (ref 7.0–26.0)
CALCIUM: 9.3 mg/dL (ref 8.4–10.4)
CHLORIDE: 104 meq/L (ref 98–109)
CO2: 26 mEq/L (ref 22–29)
CREATININE: 1 mg/dL (ref 0.7–1.3)
EGFR: 73 mL/min/{1.73_m2} — AB (ref >90)
Glucose: 135 mg/dl (ref 70–140)
Potassium: 4.3 mEq/L (ref 3.5–5.1)
Sodium: 137 mEq/L (ref 136–145)
Total Protein: 6.7 g/dL (ref 6.4–8.3)

## 2016-04-14 NOTE — Progress Notes (Signed)
  [place] OFFICE PROGRESS NOTE   Diagnosis: Colon cancer  INTERVAL HISTORY:   Mr. Muscarella returns as scheduled. Xeloda was placed on hold last week's secondary to mucositis. He continues to have erythema of the lips. The tongue ulcer has healed. He is eating. He has erythema of the hands, but no pain. He is using a moisturizer. No diarrhea.  Objective:  Vital signs in last 24 hours:  Blood pressure 117/71, pulse 85, temperature 98 F (36.7 C), temperature source Oral, resp. rate 18, height 5' 5.5" (1.664 m), weight 157 lb 12.8 oz (71.6 kg), SpO2 100 %.    HEENT: Healing ulcers with intense erythema at the lips, no thrush, no tongue or buccal ulcers Resp: Lungs clear bilaterally Cardio: Regular rate and rhythm GI: No hepatosplenomegaly, nontender Vascular: No leg edema  Skin: Dry erythematous rash over the back, erythema of the hands, dry desquamation at the soles    Lab Results:  Lab Results  Component Value Date   WBC 12.8 (H) 04/14/2016   HGB 13.3 04/14/2016   HCT 39.6 04/14/2016   MCV 89.2 04/14/2016   PLT 259 04/14/2016   NEUTROABS 8.3 (H) 04/14/2016     Medications: I have reviewed the patient's current medications.  Assessment/Plan: 1. 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 History of colonic polyps  3. Iron deficiency anemia-Resolved  4. History of coronary artery disease  5. 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   Mucositis secondary to Xeloda  7.   Skin rash  secondary to Xeloda   Disposition:  Todd French continues to have erythema of the lips. He has resolving hand/foot skin toxicity. Xeloda will remain on hold. He will return for reassessment on 04/23/2016. The plan is to dose reduce the Xeloda with cycle 4 when the mucositis and skin toxicity have further improved.  Betsy Coder, MD  04/14/2016  9:00 AM

## 2016-04-14 NOTE — Telephone Encounter (Signed)
Gave patient AVS and calender per 04/14/2016 los

## 2016-04-23 ENCOUNTER — Ambulatory Visit (HOSPITAL_BASED_OUTPATIENT_CLINIC_OR_DEPARTMENT_OTHER): Payer: Federal, State, Local not specified - PPO | Admitting: Nurse Practitioner

## 2016-04-23 ENCOUNTER — Telehealth: Payer: Self-pay | Admitting: Nurse Practitioner

## 2016-04-23 VITALS — BP 136/81 | HR 85 | Temp 97.8°F | Resp 18 | Wt 159.5 lb

## 2016-04-23 DIAGNOSIS — L03032 Cellulitis of left toe: Secondary | ICD-10-CM | POA: Diagnosis not present

## 2016-04-23 DIAGNOSIS — C183 Malignant neoplasm of hepatic flexure: Secondary | ICD-10-CM | POA: Diagnosis not present

## 2016-04-23 MED ORDER — CEPHALEXIN 500 MG PO CAPS: 500.0000 mg | ORAL_CAPSULE | Freq: Two times a day (BID) | ORAL | 0 refills | Status: DC

## 2016-04-23 NOTE — Telephone Encounter (Signed)
Appointments scheduled per 3/15 LOS. Patient given AVS report and calendars with future scheduled appointments. °

## 2016-04-23 NOTE — Progress Notes (Signed)
  [name] OFFICE PROGRESS NOTE   Diagnosis:  Colon cancer  INTERVAL HISTORY:   Todd French returns as scheduled. He began cycle 3 Xeloda 03/27/2016. Xeloda was placed on hold 04/03/2016 due to mucositis. Xeloda continued to be on hold following office visit 04/14/2016 due to persistent mucositis.  Lips remain sore. He thinks the lip erythema is better. Ulcerations within the mouth have healed. Palms and soles continue to be red with no significant associated pain. His wife is concerned the left big toe is infected. After the last office visit he "soaked" his toe and his wife partially removed the nail.  Objective:  Vital signs in last 24 hours:  Blood pressure 136/81, pulse 85, temperature 97.8 F (36.6 C), temperature source Oral, resp. rate 18, weight 159 lb 8 oz (72.3 kg), SpO2 100 %.    HEENT: Lips are erythematous with areas of healing ulceration. No erythema or ulceration within the oral cavity. Resp: Lungs clear bilaterally. Cardio: Regular rate and rhythm. GI: Abdomen soft and nontender. No hepatomegaly. Vascular: No leg edema. Skin: Marked dry desquamation of bilateral soles left greater than right. Soles are erythematous. Left great toenail bed with surrounding erythema, drainage. The toenail itself is partially missing. Palms with erythema, dryness. Skin in general has a dry appearance.   Lab Results:  Lab Results  Component Value Date   WBC 12.8 (H) 04/14/2016   HGB 13.3 04/14/2016   HCT 39.6 04/14/2016   MCV 89.2 04/14/2016   PLT 259 04/14/2016   NEUTROABS 8.3 (H) 04/14/2016    Imaging:  No results found.  Medications: I have reviewed the patient's current medications.  Assessment/Plan: 1. 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 History of colonic polyps  3. Iron deficiency anemia-Resolved  4. History of coronary artery disease  5. 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   Mucositis secondary to Xeloda  7.   Skin rash secondary to Xeloda   Disposition:Todd French continues to have evidence of mucositis and hand-foot syndrome. We will continue to hold Xeloda.  The left great toe/toenail may be infected. He will begin a 7 day course of cephalexin. He and his wife were instructed to contact the office if the erythema increases.  He will return for a follow-up visit on 05/04/2016 for reevaluation. He will contact the office in the interim as outlined above or with any other problems.   Plan reviewed with Dr. Benay Spice. 25 minutes were spent face-to-face at today's visit with the majority of that time involved in counseling/coordination of care.    Ned Card ANP/GNP-BC   04/23/2016  9:13 AM

## 2016-05-04 ENCOUNTER — Ambulatory Visit: Payer: Medicare Other

## 2016-05-04 ENCOUNTER — Telehealth: Payer: Self-pay | Admitting: Oncology

## 2016-05-04 ENCOUNTER — Ambulatory Visit (HOSPITAL_BASED_OUTPATIENT_CLINIC_OR_DEPARTMENT_OTHER): Payer: Federal, State, Local not specified - PPO | Admitting: Oncology

## 2016-05-04 ENCOUNTER — Other Ambulatory Visit (HOSPITAL_BASED_OUTPATIENT_CLINIC_OR_DEPARTMENT_OTHER): Payer: Federal, State, Local not specified - PPO

## 2016-05-04 VITALS — BP 133/62 | HR 78 | Temp 98.1°F | Resp 18 | Ht 65.5 in | Wt 157.6 lb

## 2016-05-04 DIAGNOSIS — K1230 Oral mucositis (ulcerative), unspecified: Secondary | ICD-10-CM

## 2016-05-04 DIAGNOSIS — C183 Malignant neoplasm of hepatic flexure: Secondary | ICD-10-CM | POA: Diagnosis not present

## 2016-05-04 LAB — CBC WITH DIFFERENTIAL/PLATELET
BASO%: 0.7 % (ref 0.0–2.0)
Basophils Absolute: 0.1 10*3/uL (ref 0.0–0.1)
EOS%: 8 % — AB (ref 0.0–7.0)
Eosinophils Absolute: 0.8 10*3/uL — ABNORMAL HIGH (ref 0.0–0.5)
HEMATOCRIT: 41.2 % (ref 38.4–49.9)
HEMOGLOBIN: 14 g/dL (ref 13.0–17.1)
LYMPH%: 30.8 % (ref 14.0–49.0)
MCH: 29.9 pg (ref 27.2–33.4)
MCHC: 33.9 g/dL (ref 32.0–36.0)
MCV: 88.3 fL (ref 79.3–98.0)
MONO#: 0.9 10*3/uL (ref 0.1–0.9)
MONO%: 8.2 % (ref 0.0–14.0)
NEUT%: 52.3 % (ref 39.0–75.0)
NEUTROS ABS: 5.4 10*3/uL (ref 1.5–6.5)
PLATELETS: 257 10*3/uL (ref 140–400)
RBC: 4.67 10*6/uL (ref 4.20–5.82)
RDW: 21.3 % — ABNORMAL HIGH (ref 11.0–14.6)
WBC: 10.4 10*3/uL — AB (ref 4.0–10.3)
lymph#: 3.2 10*3/uL (ref 0.9–3.3)

## 2016-05-04 LAB — COMPREHENSIVE METABOLIC PANEL
ALBUMIN: 3.8 g/dL (ref 3.5–5.0)
ALK PHOS: 110 U/L (ref 40–150)
ALT: 26 U/L (ref 0–55)
ANION GAP: 9 meq/L (ref 3–11)
AST: 24 U/L (ref 5–34)
BILIRUBIN TOTAL: 0.59 mg/dL (ref 0.20–1.20)
BUN: 16 mg/dL (ref 7.0–26.0)
CALCIUM: 9.8 mg/dL (ref 8.4–10.4)
CO2: 25 mEq/L (ref 22–29)
CREATININE: 0.9 mg/dL (ref 0.7–1.3)
Chloride: 102 mEq/L (ref 98–109)
EGFR: 81 mL/min/{1.73_m2} — ABNORMAL LOW (ref >90)
Glucose: 110 mg/dl (ref 70–140)
Potassium: 4.1 mEq/L (ref 3.5–5.1)
Sodium: 137 mEq/L (ref 136–145)
TOTAL PROTEIN: 7.2 g/dL (ref 6.4–8.3)

## 2016-05-04 MED ORDER — MAGIC MOUTHWASH: 5.0000 mL | Freq: Four times a day (QID) | ORAL | 0 refills | Status: DC | PRN

## 2016-05-04 NOTE — Telephone Encounter (Signed)
Gave patient AVS and calender per 05/04/2016 los.  

## 2016-05-04 NOTE — Progress Notes (Signed)
  [place] OFFICE PROGRESS NOTE   Diagnosis: Colon cancer  INTERVAL HISTORY:   Todd French returns as scheduled. Xeloda remains on hold. He was treated with Keflex for a toenail infection. The infection improved. He reports increased soreness at the tongue and lips. One episode of diarrhea this morning.  Objective:  Vital signs in last 24 hours:  Blood pressure 133/62, pulse 78, temperature 98.1 F (36.7 C), temperature source Oral, resp. rate 18, height 5' 5.5" (1.664 m), weight 157 lb 9.6 oz (71.5 kg), SpO2 100 %.    HEENT: Superficial ulcers at the lips and subungual areas. Erythema at the buccal mucosa. Resp: Lungs clear bilaterally Cardio: Regular rate and rhythm GI: No hepatosplenomegaly, nontender Vascular: No leg edema  Skin: Skin thickening at the palms. Superficial flaking at the soles. No erythema. No evidence of infection at the left great toenail   Lab Results:  Lab Results  Component Value Date   WBC 10.4 (H) 05/04/2016   HGB 14.0 05/04/2016   HCT 41.2 05/04/2016   MCV 88.3 05/04/2016   PLT 257 05/04/2016   NEUTROABS 5.4 05/04/2016     Medications: I have reviewed the patient's current medications.  Assessment/Plan: 1. 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 History of colonic polyps  3. Iron deficiency anemia-Resolved  4. History of coronary artery disease  5. 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 Mucositis secondary to Xeloda  7. Skin rash secondary to  Xeloda     Disposition:  Todd French continues to have significant mucositis in the mouth and lips. The hands and feet appear improved. Xeloda will remain on hold. He will try Magic mouthwash. He will return for an office visit on 05/13/2016.  We will check him for DPD deficiency.  Betsy Coder, MD  05/04/2016  8:23 AM

## 2016-05-11 LAB — DPD 5-FLUOROURACIL TOXICITY

## 2016-05-13 ENCOUNTER — Telehealth: Payer: Self-pay | Admitting: Oncology

## 2016-05-13 ENCOUNTER — Ambulatory Visit (HOSPITAL_BASED_OUTPATIENT_CLINIC_OR_DEPARTMENT_OTHER): Payer: Medicare Other | Admitting: Oncology

## 2016-05-13 VITALS — BP 109/56 | HR 67 | Temp 98.3°F | Resp 18 | Wt 159.0 lb

## 2016-05-13 DIAGNOSIS — C183 Malignant neoplasm of hepatic flexure: Secondary | ICD-10-CM | POA: Diagnosis present

## 2016-05-13 DIAGNOSIS — K1230 Oral mucositis (ulcerative), unspecified: Secondary | ICD-10-CM | POA: Diagnosis not present

## 2016-05-13 NOTE — Telephone Encounter (Signed)
Gave patient AVS and scheduled appt per 05/13/2016 los.

## 2016-05-13 NOTE — Progress Notes (Signed)
  [facility] OFFICE PROGRESS NOTE   Diagnosis: Colon cancer  INTERVAL HISTORY:   Todd French returns as scheduled. He continues to have soreness and erythema at the lips. This limits his diet choices. The mouth is sore with tooth brushing. He notes persistent ulceration under the tongue. No pain at the hands or feet.  Objective:  Vital signs in last 24 hours:  Blood pressure (!) 109/56, pulse 67, temperature 98.3 F (36.8 C), temperature source Oral, resp. rate 18, weight 159 lb (72.1 kg).    HEENT: Erythema at the posterior left buccal mucosa, healing ulcers at the subungual area, erythema and swelling of the lips Resp: Lungs clear bilaterally Cardio: Regular rate and rhythm GI: No hepatosplenomegaly Vascular: No leg edema  Skin: Thickening and hyperpigmentation of the hands without skin breakdown, dry desquamation at the soles    Lab Results:  Lab Results  Component Value Date   WBC 10.4 (H) 05/04/2016   HGB 14.0 05/04/2016   HCT 41.2 05/04/2016   MCV 88.3 05/04/2016   PLT 257 05/04/2016   NEUTROABS 5.4 05/04/2016     Medications: I have reviewed the patient's current medications.  Assessment/Plan: 1. 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 History of colonic polyps  3. Iron deficiency anemia-Resolved  4. History of coronary artery disease  5. 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 Mucositis secondary to Xeloda  7.  Skin rash secondary to Xeloda   Disposition:  Todd French has persistent mucositis. I think it is very likely the mucositis is related to capecitabine. This is a very unusual time course for resolution of the mucositis. DPD deficiency testing returned negative. I am reluctant to administer additional 5-fluorouracil based on the prolonged mucositis.  He will continue Magic mouthwash for relief of discomfort. The mouth ulcers appear to be slowly healing.  Todd French will return for an office visit in 3 weeks.  Betsy Coder, MD  05/13/2016  12:17 PM

## 2016-06-02 ENCOUNTER — Telehealth: Payer: Self-pay | Admitting: Oncology

## 2016-06-02 ENCOUNTER — Other Ambulatory Visit: Payer: Self-pay

## 2016-06-02 ENCOUNTER — Ambulatory Visit (HOSPITAL_BASED_OUTPATIENT_CLINIC_OR_DEPARTMENT_OTHER): Payer: Federal, State, Local not specified - PPO | Admitting: Nurse Practitioner

## 2016-06-02 VITALS — BP 133/63 | HR 78 | Temp 98.6°F | Resp 16 | Ht 65.0 in | Wt 156.9 lb

## 2016-06-02 DIAGNOSIS — C183 Malignant neoplasm of hepatic flexure: Secondary | ICD-10-CM

## 2016-06-02 DIAGNOSIS — K1230 Oral mucositis (ulcerative), unspecified: Secondary | ICD-10-CM | POA: Diagnosis not present

## 2016-06-02 DIAGNOSIS — C189 Malignant neoplasm of colon, unspecified: Secondary | ICD-10-CM

## 2016-06-02 MED ORDER — MAGIC MOUTHWASH: 5.0000 mL | Freq: Four times a day (QID) | ORAL | 0 refills | Status: DC | PRN

## 2016-06-02 NOTE — Progress Notes (Addendum)
  [place] OFFICE PROGRESS NOTE   Diagnosis:  Colon cancer  INTERVAL HISTORY:   Mr. Todd French returns as scheduled. The tongue ulcer has healed. Lips are less sore. No diarrhea. No hand or foot pain. He continues to note redness on the palms and dryness on the soles.  Objective:  Vital signs in last 24 hours:  Blood pressure 133/63, pulse 78, temperature 98.6 F (37 C), resp. rate 16, height '5\' 5"'$  (1.651 m), weight 156 lb 14.4 oz (71.2 kg), SpO2 98 %.    HEENT: Healing ulcerations buccal mucosa and left lateral tongue. Lips with mild erythema, no edema. Resp: Lungs clear bilaterally. Cardio: Regular rate and rhythm. GI: Abdomen soft and nontender. No organomegaly. Vascular: No leg edema. Skin: Palms with mild erythema. Soles with areas of dry desquamation.    Lab Results:  Lab Results  Component Value Date   WBC 10.4 (H) 05/04/2016   HGB 14.0 05/04/2016   HCT 41.2 05/04/2016   MCV 88.3 05/04/2016   PLT 257 05/04/2016   NEUTROABS 5.4 05/04/2016    Imaging:  No results found.  Medications: I have reviewed the patient's current medications.  Assessment/Plan: 1. 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 History of colonic polyps  3. Iron deficiency anemia-Resolved  4. History of coronary artery disease  5. 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 Mucositis secondary to Xeloda  7. Skin rash secondary to  Xeloda   Disposition:Todd French appears stable. The mucositis continues to slowly improve as do the changes of hand-foot syndrome. No further 5-fluorouracil is planned. He will continue Magic mouthwash for the mouth discomfort. He will return for a follow-up visit and CEA in approximately 6 weeks. He will contact the office in the interim with any problems.  Patient seen with Dr. Benay Spice.    Ned Card ANP/GNP-BC   06/02/2016  10:33 AM This was a shared visit with Ned Card. Todd French was interviewed and examined. The mucositis is resolving, but he has persistent ulcerations at the lip and erythema at the buccal mucosa. I do not recommend further treatment with capecitabine. He has been maintained off of adjuvant chemotherapy for the past 2 months. He will be followed with observation.  Julieanne Manson, M.D.

## 2016-06-02 NOTE — Telephone Encounter (Signed)
Appointments scheduled per 06/02/16 los. Patient was given a copy of the AVS report and appointment schedule per 06/02/16 los. °

## 2016-07-14 ENCOUNTER — Other Ambulatory Visit (HOSPITAL_BASED_OUTPATIENT_CLINIC_OR_DEPARTMENT_OTHER): Payer: Federal, State, Local not specified - PPO

## 2016-07-14 ENCOUNTER — Ambulatory Visit (HOSPITAL_BASED_OUTPATIENT_CLINIC_OR_DEPARTMENT_OTHER): Payer: Medicare Other | Admitting: Oncology

## 2016-07-14 ENCOUNTER — Telehealth: Payer: Self-pay | Admitting: Oncology

## 2016-07-14 VITALS — BP 119/54 | HR 77 | Temp 98.1°F | Resp 19 | Ht 65.0 in | Wt 154.3 lb

## 2016-07-14 DIAGNOSIS — C183 Malignant neoplasm of hepatic flexure: Secondary | ICD-10-CM | POA: Diagnosis present

## 2016-07-14 DIAGNOSIS — C189 Malignant neoplasm of colon, unspecified: Secondary | ICD-10-CM

## 2016-07-14 LAB — CEA (IN HOUSE-CHCC): CEA (CHCC-In House): 1.29 ng/mL (ref 0.00–5.00)

## 2016-07-14 NOTE — Telephone Encounter (Signed)
Lab and CT scheduled for 6 months, per 06/005/18 los. 2 bottles of Oral Contras was given to patientt, per CT order, Patient was given a copy of the AVS report and appointment schedule, per 07/14/16 los.

## 2016-07-14 NOTE — Progress Notes (Signed)
  [facility]   Diagnosis: Colon cancer  INTERVAL HISTORY:   Todd French returns as scheduled. He feels well. He is working at home. No difficulty with bowel or bladder function. Good appetite. He has pain in the right I and is seen in ophthalmologist. Mild soreness in the mouth. This does not interfere with eating. The lips have improved.  Objective:  Vital signs in last 24 hours:  Blood pressure (!) 119/54, pulse 77, temperature 98.1 F (36.7 C), temperature source Oral, resp. rate 19, height '5\' 5"'$  (1.651 m), weight 154 lb 4.8 oz (70 kg), SpO2 100 %.    HEENT: No thrush or ulcers, mild erythema surrounding the lips without skin breakdown Lymphatics: No cervical, supraclavicular, axillary, or inguinal nodes Resp: Lungs clear bilaterally, no respiratory distress Cardio: Regular rate and rhythm GI: No hepatosplenomegaly, no mass, nontender Vascular: No leg edema  Skin: Palms and soles without erythema, dryness with callus formation at the feet. Superficial ulceration between the left first and second toes consistent with trauma    Lab Results:  CEA pending   Medications: I have reviewed the patient's current medications.  Assessment/Plan: 1. 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 History of colonic polyps  3. Iron deficiency anemia-Resolved  4. History of coronary artery disease  5. Hand foot syndrome secondary to  Xeloda-resolved  6. Mucositis secondary to [facility]  7. Skin rash secondary to [facility]     Disposition:   He has recovered from the mucositis and skin changes secondary to Xeloda. He is in clinical remission from colon cancer. Todd French will return for an office visit, CEA, and restaging CTs in 6 months.  We will follow-up on the CEA from today.  15 minutes were spent with the patient today. The majority of the time was used for counseling and coordination of care.  Donneta Romberg, MD  07/14/2016  12:32 PM

## 2016-07-15 ENCOUNTER — Telehealth: Payer: Self-pay | Admitting: *Deleted

## 2016-07-15 NOTE — Telephone Encounter (Signed)
Message left for pt to call [place] back for lab results.

## 2016-07-15 NOTE — Telephone Encounter (Signed)
-----   Message from Ladell Pier, MD sent at 07/15/2016  4:03 PM EDT ----- Please call patient, cea is normal

## 2016-07-16 ENCOUNTER — Telehealth: Payer: Self-pay | Admitting: *Deleted

## 2016-07-16 NOTE — Telephone Encounter (Signed)
Patient notified per order of Dr. Benay Spice that cea is normal.  Patient appreciative of call and has no questions at this time.

## 2016-07-16 NOTE — Telephone Encounter (Signed)
-----   Message from Ladell Pier, MD sent at 07/15/2016  4:03 PM EDT ----- Please call patient, cea is normal

## 2016-07-16 NOTE — Telephone Encounter (Signed)
"  This is Paxtyn Boyar calling for my husband Zadkiel.  Someone called late yesterday for [place] to call for results from Dr. Benay Spice."  Informed her the CEA tumor marker is normal = 1.29.  Answered questions in reference to the normal range is 0.00 to 5.00 so this is good news.  No further questions.

## 2016-12-15 ENCOUNTER — Telehealth: Payer: Self-pay

## 2016-12-15 NOTE — Telephone Encounter (Signed)
Wife called asking about the schedule for pt CT scan. It is due December 6. Gave her number for central scheduling and sent inbasket to [facility] for prior auth

## 2016-12-15 NOTE — Telephone Encounter (Signed)
S/w wife the scheduler will call with labs before the CT.

## 2016-12-16 ENCOUNTER — Telehealth: Payer: Self-pay | Admitting: Oncology

## 2016-12-16 NOTE — Telephone Encounter (Signed)
Scheduled appt per 11/7 sch message - left message for patient with appt date and time and sent reminder letter in the mail.

## 2017-01-11 ENCOUNTER — Other Ambulatory Visit (HOSPITAL_BASED_OUTPATIENT_CLINIC_OR_DEPARTMENT_OTHER): Payer: Federal, State, Local not specified - PPO

## 2017-01-11 ENCOUNTER — Ambulatory Visit (HOSPITAL_COMMUNITY)
Admission: RE | Admit: 2017-01-11 | Discharge: 2017-01-11 | Disposition: A | Discharge status: Home / Self Care | Payer: Federal, State, Local not specified - PPO | Source: Ambulatory Visit | Attending: Oncology | Admitting: Oncology

## 2017-01-11 DIAGNOSIS — I2510 Atherosclerotic heart disease of native coronary artery without angina pectoris: Secondary | ICD-10-CM | POA: Insufficient documentation

## 2017-01-11 DIAGNOSIS — C189 Malignant neoplasm of colon, unspecified: Secondary | ICD-10-CM

## 2017-01-11 DIAGNOSIS — J439 Emphysema, unspecified: Secondary | ICD-10-CM | POA: Diagnosis not present

## 2017-01-11 DIAGNOSIS — C183 Malignant neoplasm of hepatic flexure: Secondary | ICD-10-CM

## 2017-01-11 DIAGNOSIS — I700 Atherosclerosis of aorta: Secondary | ICD-10-CM | POA: Diagnosis not present

## 2017-01-11 DIAGNOSIS — K760 Fatty (change of) liver, not elsewhere classified: Secondary | ICD-10-CM | POA: Diagnosis not present

## 2017-01-11 LAB — CEA (IN HOUSE-CHCC): CEA (CHCC-IN HOUSE): 1.92 ng/mL (ref 0.00–5.00)

## 2017-01-11 LAB — BASIC METABOLIC PANEL
Anion Gap: 10 mEq/L (ref 3–11)
BUN: 14.2 mg/dL (ref 7.0–26.0)
CALCIUM: 10.1 mg/dL (ref 8.4–10.4)
CHLORIDE: 105 meq/L (ref 98–109)
CO2: 27 meq/L (ref 22–29)
CREATININE: 1 mg/dL (ref 0.7–1.3)
Glucose: 109 mg/dl (ref 70–140)
Potassium: 4.9 mEq/L (ref 3.5–5.1)
SODIUM: 141 meq/L (ref 136–145)

## 2017-01-11 MED ORDER — IOPAMIDOL (ISOVUE-300) INJECTION 61%
INTRAVENOUS | Status: AC
  Administered 2017-01-11: 100 mL
  Filled 2017-01-11: qty 100

## 2017-01-14 ENCOUNTER — Ambulatory Visit (HOSPITAL_BASED_OUTPATIENT_CLINIC_OR_DEPARTMENT_OTHER): Payer: Federal, State, Local not specified - PPO | Admitting: Oncology

## 2017-01-14 ENCOUNTER — Other Ambulatory Visit: Payer: Federal, State, Local not specified - PPO

## 2017-01-14 ENCOUNTER — Encounter: Payer: Self-pay | Admitting: Oncology

## 2017-01-14 ENCOUNTER — Other Ambulatory Visit: Payer: Self-pay | Admitting: *Deleted

## 2017-01-14 ENCOUNTER — Other Ambulatory Visit: Payer: Self-pay

## 2017-01-14 ENCOUNTER — Telehealth: Payer: Self-pay | Admitting: Oncology

## 2017-01-14 VITALS — BP 144/70 | HR 86 | Temp 97.6°F | Resp 17 | Ht 65.0 in | Wt 166.1 lb

## 2017-01-14 DIAGNOSIS — C779 Secondary and unspecified malignant neoplasm of lymph node, unspecified: Secondary | ICD-10-CM | POA: Diagnosis not present

## 2017-01-14 DIAGNOSIS — C183 Malignant neoplasm of hepatic flexure: Secondary | ICD-10-CM | POA: Diagnosis not present

## 2017-01-14 DIAGNOSIS — Z23 Encounter for immunization: Secondary | ICD-10-CM | POA: Diagnosis not present

## 2017-01-14 MED ORDER — INFLUENZA VAC SPLIT HIGH-DOSE 0.5 ML IM SUSY
0.5000 mL | PREFILLED_SYRINGE | Freq: Once | INTRAMUSCULAR | Status: DC
  Administered 2017-01-14: 0.5 mL via INTRAMUSCULAR
  Filled 2017-01-14: qty 0.5

## 2017-01-14 MED ORDER — DOCUSATE SODIUM 100 MG PO CAPS
100.0000 mg | ORAL_CAPSULE | Freq: Two times a day (BID) | ORAL | 0 refills | Status: DC
Start: 2017-01-14 — End: 2017-01-14

## 2017-01-14 MED ORDER — POLYETHYLENE GLYCOL 3350 17 G PO PACK: 17.0000 g | PACK | Freq: Every day | ORAL | 0 refills | Status: DC

## 2017-01-14 NOTE — Progress Notes (Signed)
Patient here for follow up states "he feels great"

## 2017-01-14 NOTE — Telephone Encounter (Signed)
Scheduled appt per 12/6 los - Gave patient AVS and calender per los 

## 2017-01-14 NOTE — Progress Notes (Signed)
[facility] OFFICE PROGRESS NOTE   Diagnosis: Colon cancer  INTERVAL HISTORY:   Todd French returns as scheduled.  He feels well.  The mouth soreness has improved.  He is eating without difficulty.  He continues to have dry skin.  He has erythema of the face. Objective:  Vital signs in last 24 hours:  Blood pressure (!) 144/70, pulse 86, temperature 97.6 F (36.4 C), temperature source Oral, resp. rate 17, height '5\' 5"'  (1.651 m), weight 166 lb 1.6 oz (75.3 kg), SpO2 98 %.    HEENT: No thrush or ulcers Lymphatics: No cervical, supraclavicular, axillary, or inguinal nodes Resp: Lungs clear bilaterally Cardio: Regular rate and rhythm GI: No hepatosplenomegaly Vascular: No leg edema  Skin: Erythema of the chin, mild confluent erythema over the back, dryness of the hands and feet.  Skin thickening at the soles   Lab Results:   CMP     Component Value Date/Time   NA 141 01/11/2017 0903   K 4.9 01/11/2017 0903   CL 105 01/17/2016 0425   CO2 27 01/11/2017 0903   GLUCOSE 109 01/11/2017 0903   BUN 14.2 01/11/2017 0903   CREATININE 1.0 01/11/2017 0903   CALCIUM 10.1 01/11/2017 0903   PROT 7.2 05/04/2016 0739   ALBUMIN 3.8 05/04/2016 0739   AST 24 05/04/2016 0739   ALT 26 05/04/2016 0739   ALKPHOS 110 05/04/2016 0739   BILITOT 0.59 05/04/2016 0739   GFRNONAA >60 01/17/2016 0425   GFRAA >60 01/17/2016 0425    Lab Results  Component Value Date   CEA1 1.92 01/11/2017    No results found for: INR  Imaging:  Ct Chest W Contrast  Result Date: 01/11/2017 CLINICAL DATA:  Colon cancer EXAM: CT CHEST, ABDOMEN, AND PELVIS WITH CONTRAST TECHNIQUE: Multidetector CT imaging of the chest, abdomen and pelvis was performed following the standard protocol during bolus administration of intravenous contrast. CONTRAST:  141m ISOVUE-300 IOPAMIDOL (ISOVUE-300) INJECTION 61% COMPARISON:  12/20/2015. FINDINGS: CT CHEST FINDINGS Cardiovascular: Atherosclerotic calcification of  the arterial vasculature, including severe involvement of the coronary arteries. Heart size normal. No pericardial effusion. Mediastinum/Nodes: No pathologically enlarged mediastinal, hilar or axillary lymph nodes. Esophagus is grossly unremarkable. Lungs/Pleura: Mild centrilobular and paraseptal emphysema. 5 mm subpleural lateral left lower lobe nodule, unchanged. No pleural fluid. Airway is unremarkable. Musculoskeletal: No worrisome lytic or sclerotic lesions. Degenerative changes in the spine. Scattered bone islands in the spine. CT ABDOMEN PELVIS FINDINGS Hepatobiliary: The liver is decreased in attenuation diffusely. Liver and gallbladder are otherwise unremarkable. No biliary ductal dilatation. Pancreas: Negative. Spleen: Negative. Adrenals/Urinary Tract: Adrenal glands are unremarkable. Subcentimeter low-attenuation lesion in the right kidney is too small to characterize. Kidneys are otherwise unremarkable. Ureters are decompressed. Bladder is low in volume which may account for apparent wall thickening. Stomach/Bowel: Stomach and small bowel are unremarkable. Right hemicolectomy. Colon is otherwise unremarkable. Vascular/Lymphatic: Atherosclerotic calcification of the arterial vasculature without abdominal aortic aneurysm. No pathologically enlarged lymph nodes. Reproductive: Prostate is visualized. Other: No free fluid.  Mesenteries and peritoneum are unremarkable. Musculoskeletal: Degenerative changes in the spine. No worrisome lytic or sclerotic lesions. L1 compression fracture, unchanged. IMPRESSION: 1. Right hemicolectomy.  No evidence of metastatic disease. 2. 3. Aortic atherosclerosis (ICD10-170.0). Severe coronary artery calcification. 4.  Emphysema (ICD10-J43.9). 5. Hepatic steatosis. Electronically Signed   By: MLorin PicketM.D.   On: 01/11/2017 13:33   Ct Abdomen Pelvis W Contrast  Result Date: 01/11/2017 CLINICAL DATA:  Colon cancer EXAM: CT CHEST, ABDOMEN, AND PELVIS  WITH CONTRAST  TECHNIQUE: Multidetector CT imaging of the chest, abdomen and pelvis was performed following the standard protocol during bolus administration of intravenous contrast. CONTRAST:  179m ISOVUE-300 IOPAMIDOL (ISOVUE-300) INJECTION 61% COMPARISON:  12/20/2015. FINDINGS: CT CHEST FINDINGS Cardiovascular: Atherosclerotic calcification of the arterial vasculature, including severe involvement of the coronary arteries. Heart size normal. No pericardial effusion. Mediastinum/Nodes: No pathologically enlarged mediastinal, hilar or axillary lymph nodes. Esophagus is grossly unremarkable. Lungs/Pleura: Mild centrilobular and paraseptal emphysema. 5 mm subpleural lateral left lower lobe nodule, unchanged. No pleural fluid. Airway is unremarkable. Musculoskeletal: No worrisome lytic or sclerotic lesions. Degenerative changes in the spine. Scattered bone islands in the spine. CT ABDOMEN PELVIS FINDINGS Hepatobiliary: The liver is decreased in attenuation diffusely. Liver and gallbladder are otherwise unremarkable. No biliary ductal dilatation. Pancreas: Negative. Spleen: Negative. Adrenals/Urinary Tract: Adrenal glands are unremarkable. Subcentimeter low-attenuation lesion in the right kidney is too small to characterize. Kidneys are otherwise unremarkable. Ureters are decompressed. Bladder is low in volume which may account for apparent wall thickening. Stomach/Bowel: Stomach and small bowel are unremarkable. Right hemicolectomy. Colon is otherwise unremarkable. Vascular/Lymphatic: Atherosclerotic calcification of the arterial vasculature without abdominal aortic aneurysm. No pathologically enlarged lymph nodes. Reproductive: Prostate is visualized. Other: No free fluid.  Mesenteries and peritoneum are unremarkable. Musculoskeletal: Degenerative changes in the spine. No worrisome lytic or sclerotic lesions. L1 compression fracture, unchanged. IMPRESSION: 1. Right hemicolectomy.  No evidence of metastatic disease. 2. 3. Aortic  atherosclerosis (ICD10-170.0). Severe coronary artery calcification. 4.  Emphysema (ICD10-J43.9). 5. Hepatic steatosis. Electronically Signed   By: MLorin PicketM.D.   On: 01/11/2017 13:33    Medications: I have reviewed the patient's current medications.  Assessment/Plan: 1. 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 History of colonic polyps  3. Iron deficiency anemia-Resolved  4. History of coronary artery disease  5. Hand foot syndrome secondary to Xeloda-resolved  [facility]. [facility] 7. [facility]  Disposition:  Mr. MParsonis in clinical remission from colon cancer.  He will return for an office visit and CEA in 6 months.  He has persistent dryness and erythema of the skin.  The mucositis and skin toxicity from Xeloda has resolved.  He plans to see a dermatologist.  He received an influenza vaccine today.  15 minutes were spent with the patient today.  The majority of the time was used for counseling and coordination of care.  GBetsy Coder MD  01/14/2017  3:23 PM

## 2017-02-10 ENCOUNTER — Ambulatory Visit (AMBULATORY_SURGERY_CENTER): Payer: Self-pay

## 2017-02-10 VITALS — Ht 66.5 in | Wt 165.6 lb

## 2017-02-10 DIAGNOSIS — Z85038 Personal history of other malignant neoplasm of large intestine: Secondary | ICD-10-CM

## 2017-02-10 DIAGNOSIS — Z86010 Personal history of colonic polyps: Secondary | ICD-10-CM

## 2017-02-10 MED ORDER — NA SULFATE-K SULFATE-MG SULF 17.5-3.13-1.6 GM/177ML PO SOLN: 1.0000 | Freq: Once | ORAL | 0 refills | Status: AC

## 2017-02-10 NOTE — Progress Notes (Signed)
Per pt, no allergies to soy or egg products.Pt not taking any weight loss meds or using  O2 at home.  Pt states he is "hard to wake up past sedation"!  Pt refused Emmi video.

## 2017-02-24 ENCOUNTER — Other Ambulatory Visit: Payer: Self-pay

## 2017-02-24 ENCOUNTER — Ambulatory Visit (AMBULATORY_SURGERY_CENTER): Payer: Federal, State, Local not specified - PPO | Admitting: Internal Medicine

## 2017-02-24 ENCOUNTER — Encounter: Payer: Self-pay | Admitting: Internal Medicine

## 2017-02-24 VITALS — BP 133/82 | HR 78 | Temp 98.9°F | Resp 19 | Ht 66.5 in | Wt 165.0 lb

## 2017-02-24 DIAGNOSIS — K635 Polyp of colon: Secondary | ICD-10-CM

## 2017-02-24 DIAGNOSIS — D123 Benign neoplasm of transverse colon: Secondary | ICD-10-CM | POA: Diagnosis not present

## 2017-02-24 DIAGNOSIS — Z85038 Personal history of other malignant neoplasm of large intestine: Secondary | ICD-10-CM | POA: Diagnosis present

## 2017-02-24 MED ORDER — SODIUM CHLORIDE 0.9 % IV SOLN: 500.0000 mL | Freq: Once | INTRAVENOUS | Status: DC

## 2017-02-24 NOTE — Patient Instructions (Signed)
YOU HAD AN ENDOSCOPIC PROCEDURE TODAY AT [facility]:   Refer to the procedure report that was given to you for any specific questions about what was found during the examination.  If the procedure report does not answer your questions, please call your gastroenterologist to clarify.  If you requested that your care partner not be given the details of your procedure findings, then the procedure report has been included in a sealed envelope for you to review at your convenience later.  YOU SHOULD EXPECT: Some feelings of bloating in the abdomen. Passage of more gas than usual.  Walking can help get rid of the air that was put into your GI tract during the procedure and reduce the bloating. If you had a lower endoscopy (such as a colonoscopy or flexible sigmoidoscopy) you may notice spotting of blood in your stool or on the toilet paper. If you underwent a bowel prep for your procedure, you may not have a normal bowel movement for a few days.  Please Note:  You might notice some irritation and congestion in your nose or some drainage.  This is from the oxygen used during your procedure.  There is no need for concern and it should clear up in a day or so.  SYMPTOMS TO REPORT IMMEDIATELY:   Following lower endoscopy (colonoscopy or flexible sigmoidoscopy):  Excessive amounts of blood in the stool  Significant tenderness or worsening of abdominal pains  Swelling of the abdomen that is new, acute  Fever of 100F or higher  For urgent or emergent issues, a gastroenterologist can be reached at any hour by calling [phone].   DIET:  We do recommend a small meal at first, but then you may proceed to your regular diet.  Drink plenty of fluids but you should avoid alcoholic beverages for 24 hours.  ACTIVITY:  You should plan to take it easy for the rest of today and you should NOT DRIVE or use heavy machinery until tomorrow (because of the sedation medicines used during the test).     FOLLOW UP: Our staff will call the number listed on your records the next business day following your procedure to check on you and address any questions or concerns that you may have regarding the information given to you following your procedure. If we do not reach you, we will leave a message.  However, if you are feeling well and you are not experiencing any problems, there is no need to return our call.  We will assume that you have returned to your regular daily activities without incident.  If any biopsies were taken you will be contacted by phone or by letter within the next 1-3 weeks.  Please call us at [phone] if you have not heard about the biopsies in 3 weeks.    SIGNATURES/CONFIDENTIALITY: You and/or your care partner have signed paperwork which will be entered into your electronic medical record.  These signatures attest to the fact that that the information above on your After Visit Summary has been reviewed and is understood.  Full responsibility of the confidentiality of this discharge information lies with you and/or your care-partner.  Polyp, diverticulosis information given.  Await biopsy results, and scheduling for follow up colonoscopy.

## 2017-02-24 NOTE — Progress Notes (Signed)
Called to room to assist during endoscopic procedure.  Patient ID and intended procedure confirmed with present staff. Received instructions for my participation in the procedure from the performing physician.  

## 2017-02-24 NOTE — Progress Notes (Signed)
A and O x3. Report to RN. Tolerated MAC anesthesia well.

## 2017-02-24 NOTE — Op Note (Signed)
[name] Procedure Date: 02/24/2017 9:28 AM MRN: [id number] Endoscopist: Jerene Bears , MD Age: 82 Referring MD:  Date of Birth: [birth year] Gender: Male Account #: [id number] Procedure:                Colonoscopy Indications:              High risk colon cancer surveillance: Personal                            history of colon cancer s/p right hemicolectomy 1                            year ago, Last colonoscopy: November 2017 Medicines:                Monitored Anesthesia Care Procedure:                Pre-Anesthesia Assessment:                           - Prior to the procedure, a History and Physical                            was performed, and patient medications and                            allergies were reviewed. The patient's tolerance of                            previous anesthesia was also reviewed. The risks                            and benefits of the procedure and the sedation                            options and risks were discussed with the patient.                            All questions were answered, and informed consent                            was obtained. Prior Anticoagulants: The patient has                            taken no previous anticoagulant or antiplatelet                            agents. ASA Grade Assessment: III - A patient with                            severe systemic disease. After reviewing the risks                            and benefits, the patient was deemed in  satisfactory condition to undergo the procedure.                           After obtaining informed consent, the colonoscope                            was passed under direct vision. Throughout the                            procedure, the patient's blood pressure, pulse, and                            oxygen saturations were monitored continuously. The                            Colonoscope was  introduced through the anus and                            advanced to the the ileocolonic anastomosis. The                            colonoscopy was performed without difficulty. The                            patient tolerated the procedure well. The quality                            of the bowel preparation was good. The terminal                            ileum was photographed. Scope In: 9:39:53 AM Scope Out: 9:51:50 AM Scope Withdrawal Time: 0 hours 9 minutes 0 seconds  Total Procedure Duration: 0 hours 11 minutes 57 seconds  Findings:                 The digital rectal exam was normal.                           There was evidence of a prior end-to-side                            ileo-colonic anastomosis in the proximal transverse                            colon. This was patent and was characterized by                            healthy appearing mucosa and an intact staple line.                            The anastomosis was traversed.                           The neo-terminal ileum appeared normal.  A 5 mm polyp was found in the transverse colon. The                            polyp was sessile. The polyp was removed with a                            cold snare. Resection and retrieval were complete.                           Multiple small-mouthed diverticula were found in                            the sigmoid colon and descending colon.                           The retroflexed view of the distal rectum and anal                            verge was normal and showed no anal or rectal                            abnormalities. Complications:            No immediate complications. Estimated Blood Loss:     Estimated blood loss was minimal. Impression:               - Patent end-to-side ileo-colonic anastomosis,                            characterized by healthy appearing mucosa and an                            intact staple line.                            - The examined portion of the ileum was normal.                           - One 5 mm polyp in the transverse colon, removed                            with a cold snare. Resected and retrieved.                           - Mild diverticulosis in the sigmoid colon and in                            the descending colon.                           - The distal rectum and anal verge are normal on                            retroflexion view. Recommendation:           -  Patient has a contact number available for                            emergencies. The signs and symptoms of potential                            delayed complications were discussed with the                            patient. Return to normal activities tomorrow.                            Written discharge instructions were provided to the                            patient.                           - Resume previous diet.                           - Continue present medications.                           - Await pathology results.                           - Consider repeat colonoscopy in 3 years for                            surveillance based on personal history of colon                            cancer. Jerene Bears, MD 02/24/2017 9:56:40 AM This report has been signed electronically.

## 2017-02-25 ENCOUNTER — Telehealth: Payer: Self-pay | Admitting: *Deleted

## 2017-02-25 NOTE — Telephone Encounter (Signed)
  Follow up Call-  Call back number 02/24/2017 12/17/2015  Post procedure Call Back phone  # [phone]  Permission to leave phone message No Yes  comments machine broke -  Some recent data might be hidden     Patient questions:  Patient stated that he couldn't hear and hung up.

## 2017-02-25 NOTE — Telephone Encounter (Signed)
  Follow up Call-  Call back number 02/24/2017 12/17/2015  Post procedure Call Back phone  # [phone]  Permission to leave phone message No Yes  comments machine broke -  Some recent data might be hidden     Patient questions:  No answer. No VM. Second call.

## 2017-03-02 ENCOUNTER — Encounter: Payer: Self-pay | Admitting: Internal Medicine

## 2017-07-15 ENCOUNTER — Telehealth: Payer: Self-pay | Admitting: Emergency Medicine

## 2017-07-15 ENCOUNTER — Telehealth: Payer: Self-pay | Admitting: Oncology

## 2017-07-15 ENCOUNTER — Inpatient Hospital Stay: Payer: Federal, State, Local not specified - PPO

## 2017-07-15 ENCOUNTER — Inpatient Hospital Stay: Payer: Federal, State, Local not specified - PPO | Attending: Oncology | Admitting: Oncology

## 2017-07-15 VITALS — BP 157/79 | HR 72 | Temp 97.8°F | Resp 18 | Ht 66.5 in | Wt 164.1 lb

## 2017-07-15 DIAGNOSIS — C779 Secondary and unspecified malignant neoplasm of lymph node, unspecified: Secondary | ICD-10-CM

## 2017-07-15 DIAGNOSIS — C7989 Secondary malignant neoplasm of other specified sites: Secondary | ICD-10-CM | POA: Diagnosis not present

## 2017-07-15 DIAGNOSIS — Z86010 Personal history of colonic polyps: Secondary | ICD-10-CM | POA: Insufficient documentation

## 2017-07-15 DIAGNOSIS — I2510 Atherosclerotic heart disease of native coronary artery without angina pectoris: Secondary | ICD-10-CM | POA: Diagnosis not present

## 2017-07-15 DIAGNOSIS — C183 Malignant neoplasm of hepatic flexure: Secondary | ICD-10-CM

## 2017-07-15 DIAGNOSIS — C189 Malignant neoplasm of colon, unspecified: Secondary | ICD-10-CM

## 2017-07-15 DIAGNOSIS — Z9221 Personal history of antineoplastic chemotherapy: Secondary | ICD-10-CM | POA: Insufficient documentation

## 2017-07-15 DIAGNOSIS — Z79899 Other long term (current) drug therapy: Secondary | ICD-10-CM

## 2017-07-15 DIAGNOSIS — Z9049 Acquired absence of other specified parts of digestive tract: Secondary | ICD-10-CM | POA: Insufficient documentation

## 2017-07-15 DIAGNOSIS — L538 Other specified erythematous conditions: Secondary | ICD-10-CM | POA: Diagnosis not present

## 2017-07-15 LAB — CEA (IN HOUSE-CHCC): CEA (CHCC-In House): 1.68 ng/mL (ref 0.00–5.00)

## 2017-07-15 NOTE — Telephone Encounter (Signed)
Appointments scheduled, contrast w/ instructions provided per 6/6 los

## 2017-07-15 NOTE — Progress Notes (Signed)
  [facility] OFFICE PROGRESS NOTE   Diagnosis: Colon cancer colon  INTERVAL HISTORY:   Todd French returns as scheduled.  He feels well.  He has seasonal allergies with associated wheezing.  He continues to have erythema between some of the toes.  Good appetite and energy level.  Objective:  Vital signs in last 24 hours:  Blood pressure (!) 157/79, pulse 72, temperature 97.8 F (36.6 C), temperature source Oral, resp. rate 18, height 5' 6.5" (1.689 m), weight 164 lb 1.6 oz (74.4 kg), SpO2 100 %.    HEENT: Neck without mass Lymphatics: No cervical, supraclavicular, axillary, or inguinal nodes Resp: Scattered end inspiratory wheeze, no respiratory distress Cardio: Regular rate and rhythm GI: No hepatosplenomegaly, no mass, nontender Vascular: No leg edema  Skin: Erythema and superficial ulceration between the left fourth and fifth toes   Lab Results:   Lab Results  Component Value Date   CEA1 1.68 07/15/2017     Medications: I have reviewed the patient's current medications.   Assessment/Plan: 1. 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 History of colonic polyps  3. Iron deficiency anemia-Resolved  4. History of coronary artery disease  5. Hand foot syndrome secondary to Xeloda-resolved  6. Mucositis secondary to [facility]  7. Skin rash secondary to  [facility]   Disposition: Mr. Burkett remains in clinical remission from colon cancer.  He will return for an office visit and restaging CTs in 6 months.  The erythema and superficial skin breakdown between the toes is likely related to a yeast infection.  He will try an over-the-counter antifungal powder.  15 minutes were spent with the patient today.  The majority of the time was used for counseling and coordination of care.  Betsy Coder, MD  07/15/2017  2:17 PM

## 2017-07-15 NOTE — Telephone Encounter (Signed)
Appointments

## 2017-07-15 NOTE — Telephone Encounter (Addendum)
Pt verbalized understanding of this note.   ----- Message from Ladell Pier, MD sent at 07/15/2017  2:22 PM EDT ----- Please call patient, CEA is normal

## 2018-01-05 ENCOUNTER — Telehealth: Payer: Self-pay | Admitting: Oncology

## 2018-01-05 NOTE — Telephone Encounter (Signed)
Tried to reach regarding change in time for lab on 12/6 no voicemail came on

## 2018-01-14 ENCOUNTER — Other Ambulatory Visit: Payer: Federal, State, Local not specified - PPO

## 2018-01-14 ENCOUNTER — Ambulatory Visit (HOSPITAL_COMMUNITY)
Admission: RE | Admit: 2018-01-14 | Discharge: 2018-01-14 | Disposition: A | Discharge status: Home / Self Care | Payer: Federal, State, Local not specified - PPO | Source: Ambulatory Visit | Attending: Oncology | Admitting: Oncology

## 2018-01-14 ENCOUNTER — Inpatient Hospital Stay: Payer: Federal, State, Local not specified - PPO | Attending: Oncology

## 2018-01-14 DIAGNOSIS — C189 Malignant neoplasm of colon, unspecified: Secondary | ICD-10-CM | POA: Diagnosis present

## 2018-01-14 DIAGNOSIS — Z9049 Acquired absence of other specified parts of digestive tract: Secondary | ICD-10-CM | POA: Diagnosis not present

## 2018-01-14 DIAGNOSIS — Z9221 Personal history of antineoplastic chemotherapy: Secondary | ICD-10-CM | POA: Insufficient documentation

## 2018-01-14 DIAGNOSIS — Z85038 Personal history of other malignant neoplasm of large intestine: Secondary | ICD-10-CM | POA: Diagnosis present

## 2018-01-14 LAB — CBC WITH DIFFERENTIAL (CANCER CENTER ONLY)
Abs Immature Granulocytes: 0.03 10*3/uL (ref 0.00–0.07)
BASOS ABS: 0.1 10*3/uL (ref 0.0–0.1)
Basophils Relative: 1 %
EOS ABS: 0.5 10*3/uL (ref 0.0–0.5)
EOS PCT: 5 %
HEMATOCRIT: 47.7 % (ref 39.0–52.0)
Hemoglobin: 15.8 g/dL (ref 13.0–17.0)
Immature Granulocytes: 0 %
LYMPHS ABS: 3.5 10*3/uL (ref 0.7–4.0)
Lymphocytes Relative: 35 %
MCH: 30.7 pg (ref 26.0–34.0)
MCHC: 33.1 g/dL (ref 30.0–36.0)
MCV: 92.6 fL (ref 80.0–100.0)
Monocytes Absolute: 0.8 10*3/uL (ref 0.1–1.0)
Monocytes Relative: 8 %
NRBC: 0 % (ref 0.0–0.2)
Neutro Abs: 5.1 10*3/uL (ref 1.7–7.7)
Neutrophils Relative %: 51 %
Platelet Count: 245 10*3/uL (ref 150–400)
RBC: 5.15 MIL/uL (ref 4.22–5.81)
RDW: 12.8 % (ref 11.5–15.5)
WBC Count: 10 10*3/uL (ref 4.0–10.5)

## 2018-01-14 LAB — CMP (CANCER CENTER ONLY)
ALK PHOS: 79 U/L (ref 38–126)
ALT: 34 U/L (ref 0–44)
AST: 26 U/L (ref 15–41)
Albumin: 4 g/dL (ref 3.5–5.0)
Anion gap: 12 (ref 5–15)
BILIRUBIN TOTAL: 0.8 mg/dL (ref 0.3–1.2)
BUN: 16 mg/dL (ref 8–23)
CALCIUM: 9.8 mg/dL (ref 8.9–10.3)
CO2: 26 mmol/L (ref 22–32)
CREATININE: 1 mg/dL (ref 0.61–1.24)
Chloride: 103 mmol/L (ref 98–111)
GFR, Estimated: >60 mL/min (ref >60)
Glucose, Bld: 106 mg/dL — ABNORMAL HIGH (ref 70–99)
Potassium: 4.8 mmol/L (ref 3.5–5.1)
Sodium: 141 mmol/L (ref 135–145)
Total Protein: 7.3 g/dL (ref 6.5–8.1)

## 2018-01-14 LAB — CEA (IN HOUSE-CHCC): CEA (CHCC-In House): 2.25 ng/mL (ref 0.00–5.00)

## 2018-01-14 MED ORDER — SODIUM CHLORIDE (PF) 0.9 % IJ SOLN
INTRAMUSCULAR | Status: AC
  Filled 2018-01-14: qty 50

## 2018-01-14 MED ORDER — IOHEXOL 300 MG/ML  SOLN
100.0000 mL | Freq: Once | INTRAMUSCULAR | Status: AC | PRN
  Administered 2018-01-14: 100 mL via INTRAVENOUS

## 2018-01-17 ENCOUNTER — Inpatient Hospital Stay: Payer: Federal, State, Local not specified - PPO | Admitting: Oncology

## 2018-01-17 ENCOUNTER — Telehealth: Payer: Self-pay

## 2018-01-17 VITALS — BP 121/80 | HR 82 | Temp 97.1°F | Resp 18 | Ht 66.5 in | Wt 168.4 lb

## 2018-01-17 DIAGNOSIS — Z9221 Personal history of antineoplastic chemotherapy: Secondary | ICD-10-CM

## 2018-01-17 DIAGNOSIS — Z9049 Acquired absence of other specified parts of digestive tract: Secondary | ICD-10-CM

## 2018-01-17 DIAGNOSIS — Z85038 Personal history of other malignant neoplasm of large intestine: Secondary | ICD-10-CM

## 2018-01-17 DIAGNOSIS — C189 Malignant neoplasm of colon, unspecified: Secondary | ICD-10-CM

## 2018-01-17 NOTE — Progress Notes (Signed)
[place] OFFICE PROGRESS NOTE   Diagnosis: Colon cancer  INTERVAL HISTORY:   Mr. Harkleroad returns as scheduled.  He feels well.  Appetite.  No complaint.  Objective:  Vital signs in last 24 hours:  Blood pressure 121/80, pulse 82, temperature (!) 97.1 F (36.2 C), temperature source Oral, resp. rate 18, height 5' 6.5" (1.689 m), weight 168 lb 6.4 oz (76.4 kg), SpO2 98 %.    HEENT: Neck without mass Lymphatics: No cervical, supraclavicular, axillary, or inguinal nodes Resp: Lungs clear bilaterally Cardio: Regular rate and rhythm GI: Mass, nontender, no hepatosplenomegaly Vascular: No leg edema   Lab Results:  Lab Results  Component Value Date   WBC 10.0 01/14/2018   HGB 15.8 01/14/2018   HCT 47.7 01/14/2018   MCV 92.6 01/14/2018   PLT 245 01/14/2018   NEUTROABS 5.1 01/14/2018    CMP  Lab Results  Component Value Date   NA 141 01/14/2018   K 4.8 01/14/2018   CL 103 01/14/2018   CO2 26 01/14/2018   GLUCOSE 106 (H) 01/14/2018   BUN 16 01/14/2018   CREATININE 1.00 01/14/2018   CALCIUM 9.8 01/14/2018   PROT 7.3 01/14/2018   ALBUMIN 4.0 01/14/2018   AST 26 01/14/2018   ALT 34 01/14/2018   ALKPHOS 79 01/14/2018   BILITOT 0.8 01/14/2018   GFRNONAA >60 01/14/2018   GFRAA >60 01/14/2018    Lab Results  Component Value Date   CEA1 2.25 01/14/2018    Imaging:  Ct Chest W Contrast  Result Date: 01/15/2018 CLINICAL DATA:  Colon cancer diagnosed 11/17 with chemo in 2018. No current complaints. Prior laparoscopic partial colectomy. EXAM: CT CHEST, ABDOMEN, AND PELVIS WITH CONTRAST TECHNIQUE: Multidetector CT imaging of the chest, abdomen and pelvis was performed following the standard protocol during bolus administration of intravenous contrast. CONTRAST:  170m OMNIPAQUE IOHEXOL 300 MG/ML  SOLN COMPARISON:  01/11/2017 FINDINGS: CT CHEST FINDINGS Cardiovascular: Aortic and branch vessel atherosclerosis. Normal heart size, without pericardial  effusion. Multivessel coronary artery atherosclerosis. No central pulmonary embolism, on this non-dedicated study. Mediastinum/Nodes: No supraclavicular adenopathy. No mediastinal or hilar adenopathy. Lungs/Pleura: Clear lungs. Minimal motion degradation within the upper and mid chest. Centrilobular and paraseptal emphysema. 5 mm subpleural right lower lobe pulmonary nodule on image 80/4 is similar to the prior, favoring a benign etiology. Subpleural left lower lobe nodule is also unchanged at 5 mm on image 104/4. Musculoskeletal: No acute osseous abnormality. Bone islands within the upper and midthoracic spine are unchanged. CT ABDOMEN PELVIS FINDINGS Hepatobiliary: Mild to moderate hepatic steatosis with sparing adjacent the gallbladder. No focal liver lesion. Normal gallbladder, without biliary ductal dilatation. Pancreas: Normal, without mass or ductal dilatation. Spleen: Normal in size, without focal abnormality. Adrenals/Urinary Tract: Normal adrenal glands. Interpolar right renal too small to characterize lesion. Normal left kidney. Normal urinary bladder. Stomach/Bowel: Normal stomach, without wall thickening. Scattered colonic diverticula. Right hemicolectomy. No abnormality in the region of the enterocolic anastomosis. Normal small bowel. Vascular/Lymphatic: Aortic and branch vessel atherosclerosis. No abdominopelvic adenopathy. Reproductive: Mild prostatomegaly. Other: No significant free fluid. No evidence of omental or peritoneal disease. Musculoskeletal: Probable bone island in the left iliac, similar. Moderate L1 compression deformity is similar. IMPRESSION: 1. Status post right hemicolectomy, without evidence of recurrent or metastatic disease. 2. Bibasilar pulmonary nodules are unchanged and can be presumed benign. 3. Aortic atherosclerosis (ICD10-I70.0), coronary artery atherosclerosis and emphysema (ICD10-J43.9). 4. Hepatic steatosis. Electronically Signed   By: KAbigail MiyamotoM.D.   On: 01/15/2018  15:31   Ct Abdomen Pelvis W Contrast  Result Date: 01/15/2018 CLINICAL DATA:  Colon cancer diagnosed 11/17 with chemo in 2018. No current complaints. Prior laparoscopic partial colectomy. EXAM: CT CHEST, ABDOMEN, AND PELVIS WITH CONTRAST TECHNIQUE: Multidetector CT imaging of the chest, abdomen and pelvis was performed following the standard protocol during bolus administration of intravenous contrast. CONTRAST:  165m OMNIPAQUE IOHEXOL 300 MG/ML  SOLN COMPARISON:  01/11/2017 FINDINGS: CT CHEST FINDINGS Cardiovascular: Aortic and branch vessel atherosclerosis. Normal heart size, without pericardial effusion. Multivessel coronary artery atherosclerosis. No central pulmonary embolism, on this non-dedicated study. Mediastinum/Nodes: No supraclavicular adenopathy. No mediastinal or hilar adenopathy. Lungs/Pleura: Clear lungs. Minimal motion degradation within the upper and mid chest. Centrilobular and paraseptal emphysema. 5 mm subpleural right lower lobe pulmonary nodule on image 80/4 is similar to the prior, favoring a benign etiology. Subpleural left lower lobe nodule is also unchanged at 5 mm on image 104/4. Musculoskeletal: No acute osseous abnormality. Bone islands within the upper and midthoracic spine are unchanged. CT ABDOMEN PELVIS FINDINGS Hepatobiliary: Mild to moderate hepatic steatosis with sparing adjacent the gallbladder. No focal liver lesion. Normal gallbladder, without biliary ductal dilatation. Pancreas: Normal, without mass or ductal dilatation. Spleen: Normal in size, without focal abnormality. Adrenals/Urinary Tract: Normal adrenal glands. Interpolar right renal too small to characterize lesion. Normal left kidney. Normal urinary bladder. Stomach/Bowel: Normal stomach, without wall thickening. Scattered colonic diverticula. Right hemicolectomy. No abnormality in the region of the enterocolic anastomosis. Normal small bowel. Vascular/Lymphatic: Aortic and branch vessel atherosclerosis. No  abdominopelvic adenopathy. Reproductive: Mild prostatomegaly. Other: No significant free fluid. No evidence of omental or peritoneal disease. Musculoskeletal: Probable bone island in the left iliac, similar. Moderate L1 compression deformity is similar. IMPRESSION: 1. Status post right hemicolectomy, without evidence of recurrent or metastatic disease. 2. Bibasilar pulmonary nodules are unchanged and can be presumed benign. 3. Aortic atherosclerosis (ICD10-I70.0), coronary artery atherosclerosis and emphysema (ICD10-J43.9). 4. Hepatic steatosis. Electronically Signed   By: KAbigail MiyamotoM.D.   On: 01/15/2018 15:31    Medications: I have reviewed the patient's current medications.   Assessment/Plan: 1. 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 History of colonic polyps  3. Iron deficiency anemia-Resolved  4. History of coronary artery disease  5. Hand foot syndrome secondary to Xeloda-resolved  6. [facility] 7. [facility]  Disposition: Mr. MBushnellis in clinical remission from colon cancer.  He will return for an office visit and CEA in 6 months.  He will continue colonoscopy surveillance with Dr. PHilarie Fredrickson  15 minutes were spent with the patient today.  The majority of the time was used for counseling and  coordination of care.  GBetsy Coder MD  01/17/2018  12:13 PM

## 2018-01-17 NOTE — Telephone Encounter (Signed)
Printed avs and calender of upcoming appointment. Per 1/29 los 

## 2018-02-23 IMAGING — CT CT ABD-PELV W/ CM
2 of 5 series · 13 of 36 positions shown, 16 images · IV contrast (APPLIED)
Comparison: 12/20/2015.

CLINICAL DATA: Colon cancer

EXAM:
CT CHEST, ABDOMEN, AND PELVIS WITH CONTRAST
TECHNIQUE: Multidetector CT imaging of the chest, abdomen and pelvis was
performed following the standard protocol during bolus
administration of intravenous contrast.
CONTRAST:  100mL [id number] IOPAMIDOL ([id number]) INJECTION 61%

[Series 2: cap with · axial · 0.82mm/px · z∈[-633,-108]mm · 10 of 127 slices shown, 13 images]
[im 11/127  mediastinal]
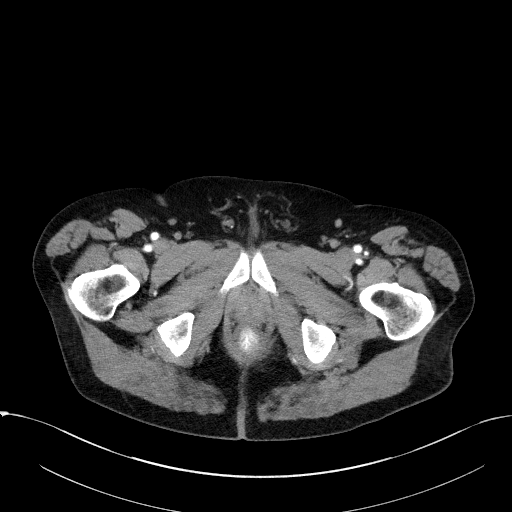
[im 11/127  lung]
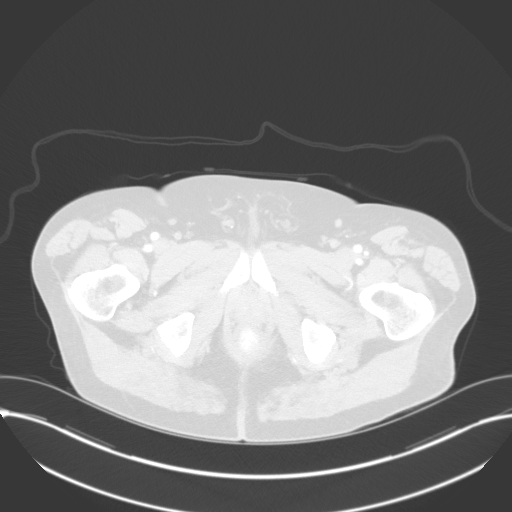
[im 22/127  lung]
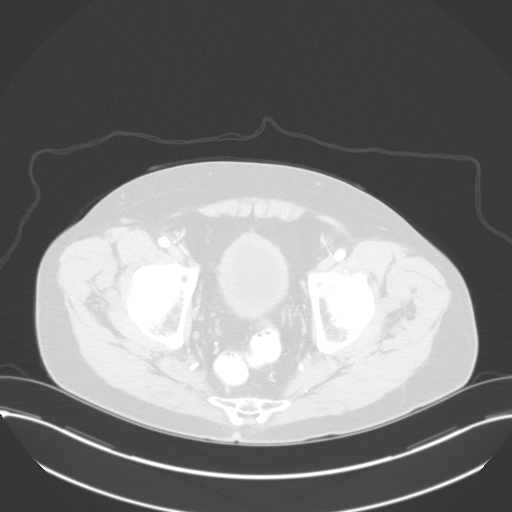
[im 32/127  lung]
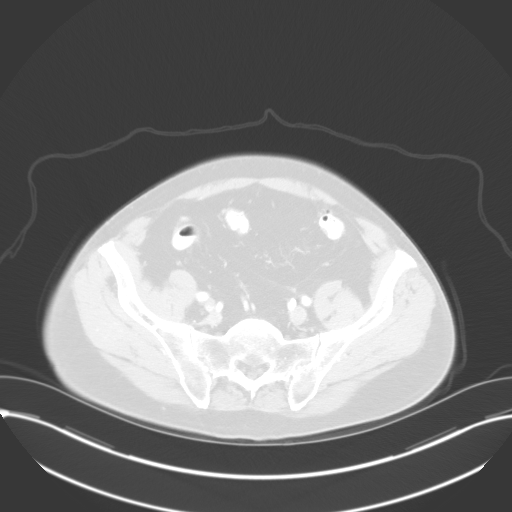
[im 43/127  lung]
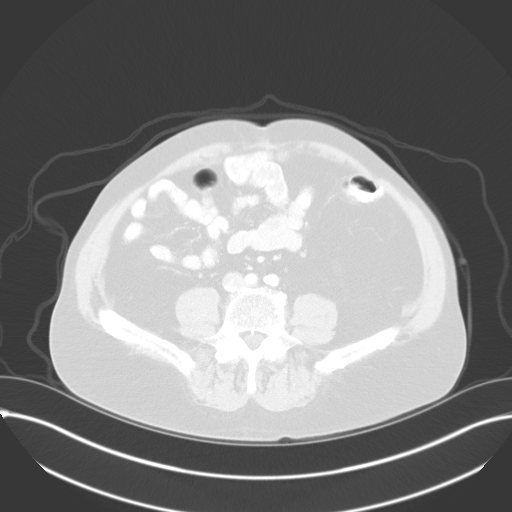
[im 53/127  mediastinal]
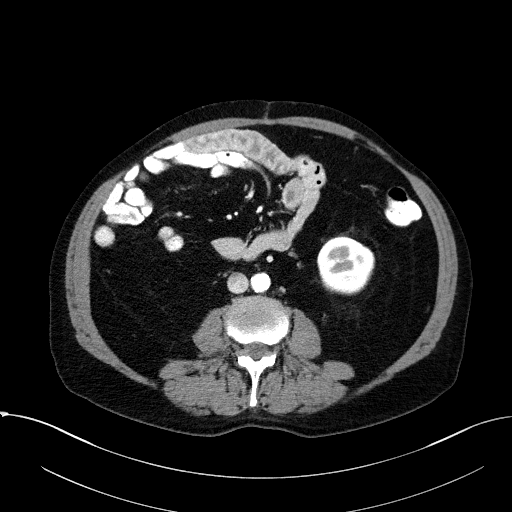
[im 53/127  lung]
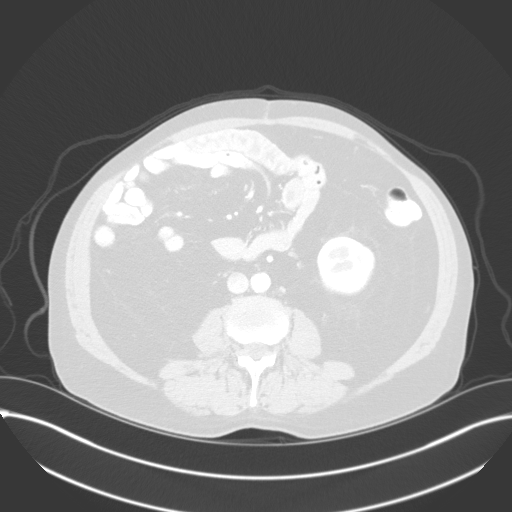
[im 74/127  lung]
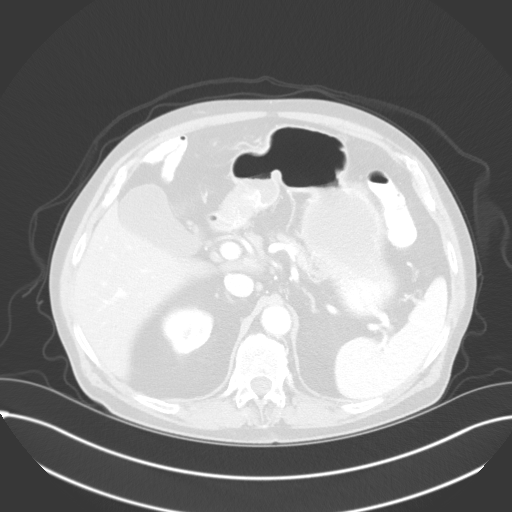
[im 85/127  lung]
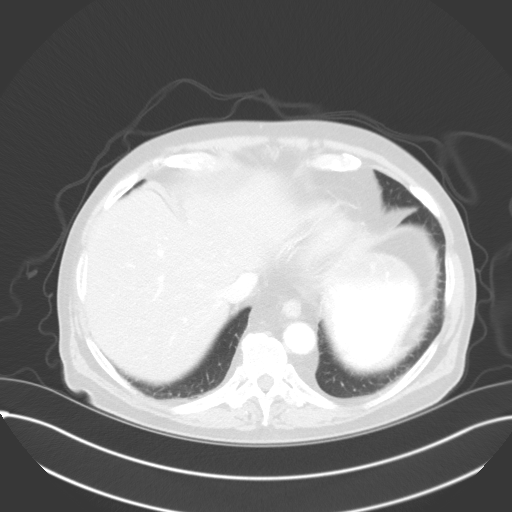
[im 95/127  lung]
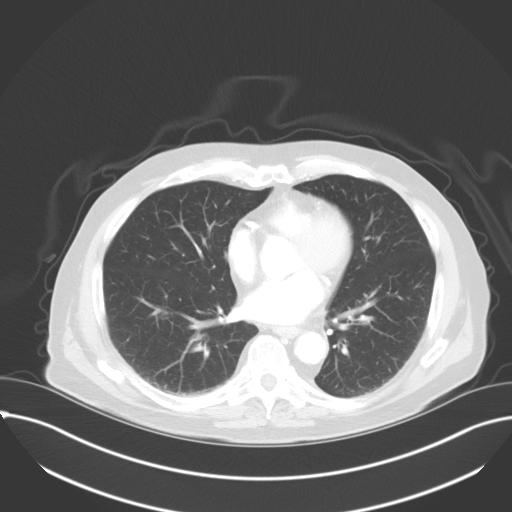
[im 106/127  mediastinal]
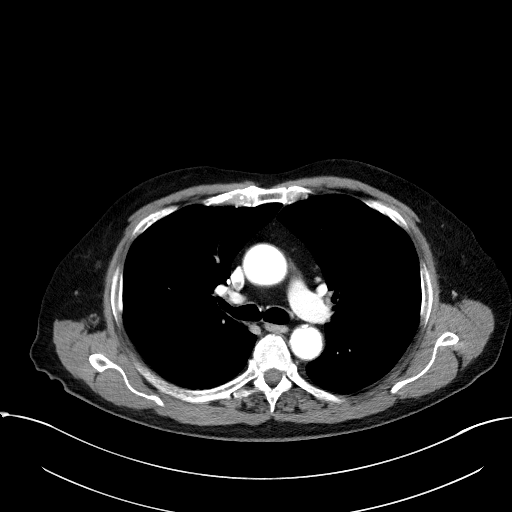
[im 106/127  lung]
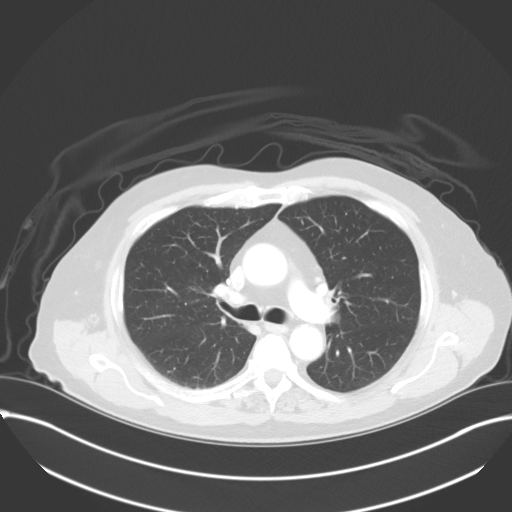
[im 116/127  lung]
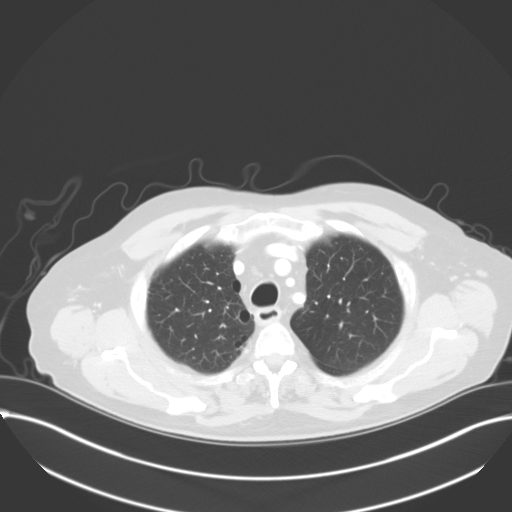

[Series 4: coronals · coronal · 0.67mm/px · 3 of 140 slices shown]
[im 28/140  lung]
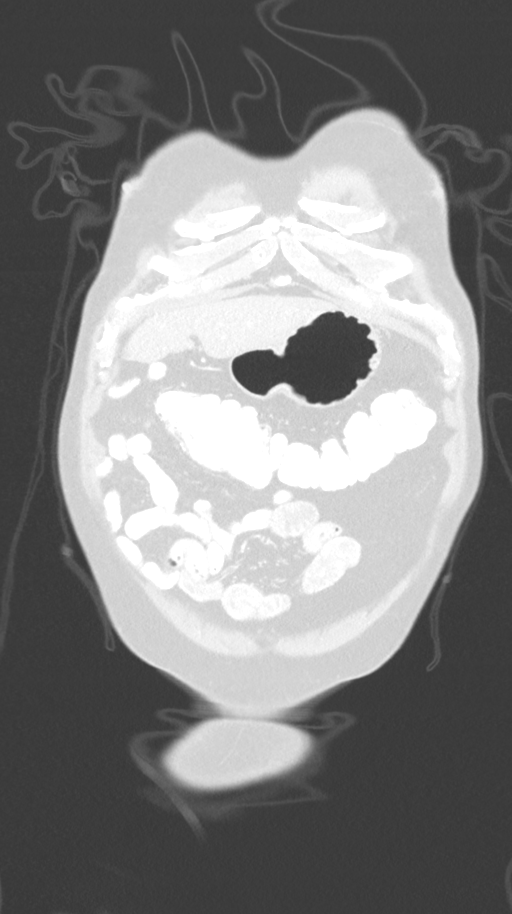
[im 56/140  lung]
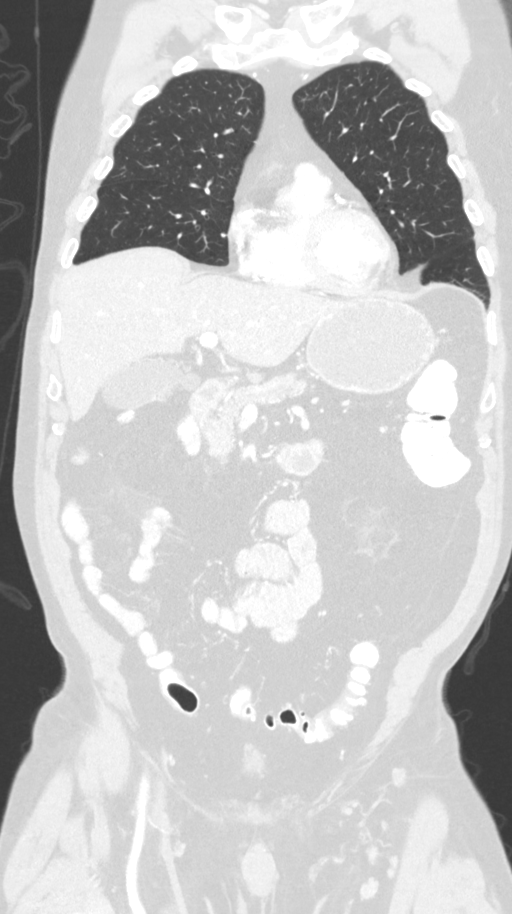
[im 84/140  lung]
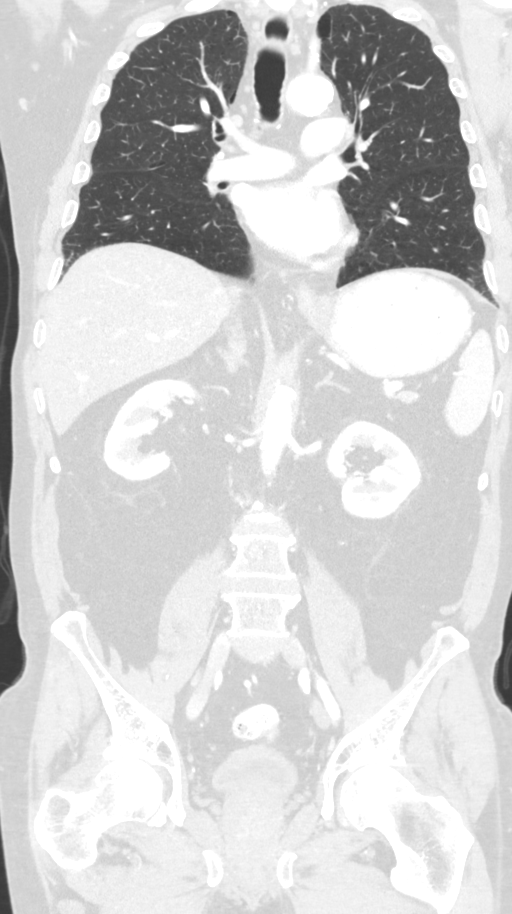

[13 of 36 positions shown; findings below may reference images not displayed]

FINDINGS: CT CHEST FINDINGS

Cardiovascular: Atherosclerotic calcification of the arterial
vasculature, including severe involvement of the coronary arteries.
Heart size normal. No pericardial effusion.

Mediastinum/Nodes: No pathologically enlarged mediastinal, hilar or
axillary lymph nodes. Esophagus is grossly unremarkable.

Lungs/Pleura: Mild centrilobular and paraseptal emphysema. 5 mm
subpleural lateral left lower lobe nodule, unchanged. No pleural
fluid. Airway is unremarkable.

Musculoskeletal: No worrisome lytic or sclerotic lesions.
Degenerative changes in the spine. Scattered bone islands in the
spine.

CT ABDOMEN PELVIS FINDINGS

Hepatobiliary: The liver is decreased in attenuation diffusely.
Liver and gallbladder are otherwise unremarkable. No biliary ductal
dilatation.

Pancreas: Negative.

Spleen: Negative.

Adrenals/Urinary Tract: Adrenal glands are unremarkable.
Subcentimeter low-attenuation lesion in the right kidney is too
small to characterize. Kidneys are otherwise unremarkable. Ureters
are decompressed. Bladder is low in volume which may account for
apparent wall thickening.

Stomach/Bowel: Stomach and small bowel are unremarkable. Right
hemicolectomy. Colon is otherwise unremarkable.

Vascular/Lymphatic: Atherosclerotic calcification of the arterial
vasculature without abdominal aortic aneurysm. No pathologically
enlarged lymph nodes.

Reproductive: Prostate is visualized.

Other: No free fluid.  Mesenteries and peritoneum are unremarkable.

Musculoskeletal: Degenerative changes in the spine. No worrisome
lytic or sclerotic lesions. L1 compression fracture, unchanged.
IMPRESSION: 1. Right hemicolectomy.  No evidence of metastatic disease.
2.
3. Aortic atherosclerosis (H0GO9-170.0). Severe coronary artery
calcification.
4.  Emphysema (H0GO9-E6A.0).
5. Hepatic steatosis.

## 2018-07-18 ENCOUNTER — Other Ambulatory Visit: Payer: Self-pay

## 2018-07-18 ENCOUNTER — Inpatient Hospital Stay: Payer: Medicare Other | Attending: Oncology | Admitting: Oncology

## 2018-07-18 VITALS — BP 157/88 | HR 85 | Temp 97.8°F | Resp 17 | Ht 66.5 in | Wt 166.4 lb

## 2018-07-18 DIAGNOSIS — Z85038 Personal history of other malignant neoplasm of large intestine: Secondary | ICD-10-CM

## 2018-07-18 DIAGNOSIS — Z9221 Personal history of antineoplastic chemotherapy: Secondary | ICD-10-CM | POA: Diagnosis not present

## 2018-07-18 DIAGNOSIS — C189 Malignant neoplasm of colon, unspecified: Secondary | ICD-10-CM

## 2018-07-18 DIAGNOSIS — Z9049 Acquired absence of other specified parts of digestive tract: Secondary | ICD-10-CM

## 2018-07-18 NOTE — Progress Notes (Signed)
  [place] OFFICE PROGRESS NOTE   Diagnosis: Colon cancer  INTERVAL HISTORY:   Mr. Dorn returns as scheduled.  Good appetite.  He is working around his home.  No difficulty with bowel function.  He continues to have occasional soreness in the mouth.  Objective:  Vital signs in last 24 hours:  Blood pressure (!) 157/88, pulse 85, temperature 97.8 F (36.6 C), temperature source Oral, resp. rate 17, height 5' 6.5" (1.689 m), weight 166 lb 6.4 oz (75.5 kg), SpO2 98 %.   Limited physical examination secondary to distancing with the COVID pandemic HEENT: Mild erythema at the distal tongue, no buccal ulcers or thrush Lymphatics: No cervical, supraclavicular, axillary, or inguinal nodes GI: No hepatomegaly, no mass, nontender Vascular: No leg edema Skin: Palms without erythema  Portacath/PICC-without erythema  Lab Results:  Lab Results  Component Value Date   WBC 10.0 01/14/2018   HGB 15.8 01/14/2018   HCT 47.7 01/14/2018   MCV 92.6 01/14/2018   PLT 245 01/14/2018   NEUTROABS 5.1 01/14/2018    CMP  Lab Results  Component Value Date   NA 141 01/14/2018   K 4.8 01/14/2018   CL 103 01/14/2018   CO2 26 01/14/2018   GLUCOSE 106 (H) 01/14/2018   BUN 16 01/14/2018   CREATININE 1.00 01/14/2018   CALCIUM 9.8 01/14/2018   PROT 7.3 01/14/2018   ALBUMIN 4.0 01/14/2018   AST 26 01/14/2018   ALT 34 01/14/2018   ALKPHOS 79 01/14/2018   BILITOT 0.8 01/14/2018   GFRNONAA >60 01/14/2018   GFRAA >60 01/14/2018    Lab Results  Component Value Date   CEA1 2.25 01/14/2018    No results found for: INR  Imaging:  No results found.  Medications: I have reviewed the patient's current medications.  1. 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, stage III (T3 N1), status post a laparoscopic right colectomy on 01/14/2016, 1/21 lymph nodes positive for metastatic carcinoma, lymphovascular and perineural invasion present  MSI-high with loss  of MLH1 and PMS2 expression  No BRAFmutation  MLH1hypermethylation present  Cycle 1 adjuvant Xeloda 02/13/2016  Cycle 2 adjuvant Xeloda 03/06/2016  Cycle 3 adjuvant Xeloda 03/27/2016 (placed on hold 04/03/2016 due to mucositis)  Negative testing for DPD deficiency 05/04/2016  CTs 01/11/2017-negative for recurrent colon cancer  Colonoscopy 02/24/2017- hyperplastic polyp removed from the transverse colon  CTs 01/14/2018- no evidence of recurrent disease  2. History of colonic polyps  3. Iron deficiency anemia-Resolved  4. History of coronary artery disease  5. Hand foot syndrome secondary to Xeloda-resolved  6. Mucositis secondary to [facility]  7. Skin rash secondary to [facility]   Disposition: Mr. Eich is in clinical remission from colon cancer.  We discussed the indication for surveillance CT imaging.  He would like to proceed with a final planned CT examination in December of this year.  Mr. Leamon Arnt will return for an office visit, CEA, and restaging CTs in 6 months.  Betsy Coder, MD  07/18/2018  3:27 PM

## 2018-07-19 ENCOUNTER — Telehealth: Payer: Self-pay | Admitting: Oncology

## 2018-07-19 NOTE — Telephone Encounter (Signed)
Scheduled appt per 6/8 los. A calendar will be mailed out. °

## 2018-09-05 ENCOUNTER — Telehealth: Payer: Self-pay | Admitting: *Deleted

## 2018-09-05 NOTE — Telephone Encounter (Signed)
"  Letta Median [phone]), calling for Marolyn Hammock.  Call us about problem with constipation.  Has been constipated since he was seen June 8th.  Started Colace week before last has helped, but he is still constipated."

## 2018-09-05 NOTE — Telephone Encounter (Signed)
FYI   "His bowels moved last Thursday and late yesterday afternoon.    Often only passing gas when he tries to empty bowels.     I tell him not to strain since he's had colon surgery.    Concerning when he does not have his normal daily bowel movement with history of cancer.      No stomach bloating, only weight gain.    No trouble walking or gardening.    Eating me out of house and home but I am trying to encourage him to drink more water.    Does he need to be seen by Dr. Benay Spice or use anything other than colace?"  [place] Triage nursing order guidelines for constipation provided.  Asked to increase water, regular activity, add high fiber foods.  Drink at least 64 oz water.  Try Gatorade, sports drinks, freeze pops, [place] icee, hot beverages to also increase hydration.  "Will notify him upon his return.

## 2019-01-16 ENCOUNTER — Ambulatory Visit (HOSPITAL_COMMUNITY): Payer: Federal, State, Local not specified - PPO

## 2019-01-17 ENCOUNTER — Inpatient Hospital Stay: Payer: Federal, State, Local not specified - PPO | Admitting: Oncology

## 2019-01-17 ENCOUNTER — Inpatient Hospital Stay: Payer: Federal, State, Local not specified - PPO | Attending: Oncology

## 2019-01-17 ENCOUNTER — Telehealth: Payer: Self-pay | Admitting: Oncology

## 2019-01-17 ENCOUNTER — Other Ambulatory Visit: Payer: Self-pay

## 2019-01-17 ENCOUNTER — Ambulatory Visit (HOSPITAL_COMMUNITY)
Admission: RE | Admit: 2019-01-17 | Discharge: 2019-01-17 | Disposition: A | Discharge status: Home / Self Care | Payer: Federal, State, Local not specified - PPO | Source: Ambulatory Visit | Attending: Oncology | Admitting: Oncology

## 2019-01-17 VITALS — BP 143/86 | HR 97 | Temp 98.0°F | Resp 18 | Ht 66.5 in | Wt 170.5 lb

## 2019-01-17 DIAGNOSIS — I2510 Atherosclerotic heart disease of native coronary artery without angina pectoris: Secondary | ICD-10-CM | POA: Insufficient documentation

## 2019-01-17 DIAGNOSIS — Z85038 Personal history of other malignant neoplasm of large intestine: Secondary | ICD-10-CM | POA: Diagnosis present

## 2019-01-17 DIAGNOSIS — Z9221 Personal history of antineoplastic chemotherapy: Secondary | ICD-10-CM | POA: Insufficient documentation

## 2019-01-17 DIAGNOSIS — C189 Malignant neoplasm of colon, unspecified: Secondary | ICD-10-CM | POA: Insufficient documentation

## 2019-01-17 DIAGNOSIS — Z9049 Acquired absence of other specified parts of digestive tract: Secondary | ICD-10-CM | POA: Insufficient documentation

## 2019-01-17 LAB — BASIC METABOLIC PANEL - CANCER CENTER ONLY
Anion gap: 10 (ref 5–15)
BUN: 14 mg/dL (ref 8–23)
CO2: 29 mmol/L (ref 22–32)
Calcium: 9.2 mg/dL (ref 8.9–10.3)
Chloride: 102 mmol/L (ref 98–111)
Creatinine: 1.1 mg/dL (ref 0.61–1.24)
GFR, Est AFR Am: >60 mL/min (ref >60)
GFR, Estimated: >60 mL/min (ref >60)
Glucose, Bld: 118 mg/dL — ABNORMAL HIGH (ref 70–99)
Potassium: 4.3 mmol/L (ref 3.5–5.1)
Sodium: 141 mmol/L (ref 135–145)

## 2019-01-17 LAB — CEA (IN HOUSE-CHCC): CEA (CHCC-In House): 1.87 ng/mL (ref 0.00–5.00)

## 2019-01-17 MED ORDER — IOHEXOL 300 MG/ML  SOLN
100.0000 mL | Freq: Once | INTRAMUSCULAR | Status: AC | PRN
  Administered 2019-01-17: 100 mL via INTRAVENOUS

## 2019-01-17 MED ORDER — SODIUM CHLORIDE (PF) 0.9 % IJ SOLN
INTRAMUSCULAR | Status: AC
  Filled 2019-01-17: qty 50

## 2019-01-17 NOTE — Telephone Encounter (Signed)
Scheduled appt per 12/8 los - pt is aware of appts gave pt AVS and calender

## 2019-01-17 NOTE — Progress Notes (Signed)
[place] OFFICE PROGRESS NOTE   Diagnosis: Colon cancer  INTERVAL HISTORY:   Todd French returns as scheduled.  He feels well.  Good appetite.  No difficulty with bowel function.  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  His vision following surgery.  Objective:  Vital signs in last 24 hours:  Blood pressure (!) 143/86, pulse 97, temperature 98 F (36.7 C), temperature source Temporal, resp. rate 18, height 5' 6.5" (1.689 m), weight 170 lb 8 oz (77.3 kg), SpO2 99 %.    Limited physical examination secondary to distancing with the Covid pandemic Lymphatics: No cervical, supraclavicular, axillary, or inguinal nodes GI: No mass, no hepatosplenomegaly, nontender Vascular: No leg edema   Lab Results:  Lab Results  Component Value Date   WBC 10.0 01/14/2018   HGB 15.8 01/14/2018   HCT 47.7 01/14/2018   MCV 92.6 01/14/2018   PLT 245 01/14/2018   NEUTROABS 5.1 01/14/2018    CMP  Lab Results  Component Value Date   NA 141 01/17/2019   K 4.3 01/17/2019   CL 102 01/17/2019   CO2 29 01/17/2019   GLUCOSE 118 (H) 01/17/2019   BUN 14 01/17/2019   CREATININE 1.10 01/17/2019   CALCIUM 9.2 01/17/2019   PROT 7.3 01/14/2018   ALBUMIN 4.0 01/14/2018   AST 26 01/14/2018   ALT 34 01/14/2018   ALKPHOS 79 01/14/2018   BILITOT 0.8 01/14/2018   GFRNONAA >60 01/17/2019   GFRAA >60 01/17/2019    Lab Results  Component Value Date   CEA1 1.87 01/17/2019    No results found for: INR  Imaging:  Ct Chest W Contrast  Result Date: 01/17/2019 CLINICAL DATA:  Restaging colon cancer EXAM: CT CHEST, ABDOMEN, AND PELVIS WITH CONTRAST TECHNIQUE: Multidetector CT imaging of the chest, abdomen and pelvis was performed following the standard protocol during bolus administration of intravenous contrast. CONTRAST:  146m OMNIPAQUE IOHEXOL 300 MG/ML SOLN, additional oral enteric contrast COMPARISON:  01/14/2018, 01/11/2017, 12/20/2015 FINDINGS: CT CHEST  FINDINGS Cardiovascular: Aortic atherosclerosis. Normal heart size. Three-vessel coronary artery calcifications. No pericardial effusion. Mediastinum/Nodes: No enlarged mediastinal, hilar, or axillary lymph nodes. Thyroid gland, trachea, and esophagus demonstrate no significant findings. Lungs/Pleura: Mild paraseptal emphysema. Stable, benign 5 mm pulmonary nodule of the peripheral left lower lobe (series 6, image 98). Stable, benign 4 mm pulmonary nodule of the dependent right lower lobe (series 6, image 72). No pleural effusion or pneumothorax. Musculoskeletal: No chest wall mass or suspicious bone lesions identified. CT ABDOMEN PELVIS FINDINGS Hepatobiliary: No solid liver abnormality is seen. Hepatic steatosis. No gallstones, gallbladder wall thickening, or biliary dilatation. Pancreas: Unremarkable. No pancreatic ductal dilatation or surrounding inflammatory changes. Spleen: Normal in size without significant abnormality. Adrenals/Urinary Tract: Adrenal glands are unremarkable. Kidneys are normal, without renal calculi, solid lesion, or hydronephrosis. Thickening of the urinary bladder. Stomach/Bowel: Stomach is within normal limits. Redemonstrated postoperative findings of right colectomy. Sigmoid diverticulosis. Vascular/Lymphatic: No significant vascular findings are present. No enlarged abdominal or pelvic lymph nodes. Reproductive: Prostatomegaly. Other: No abdominal wall hernia or abnormality. No abdominopelvic ascites. Musculoskeletal: Redemonstrated wedge deformity of the L1 vertebral body. IMPRESSION: 1. Stable postoperative findings of right colectomy. 2. No evidence of metastatic disease in the chest, abdomen, or pelvis. 3. Stable, benign small pulmonary nodules. 4. Three-vessel coronary artery calcifications. 5. Emphysema (ICD10-J43.9). 6. Aortic Atherosclerosis (ICD10-I70.0). Electronically Signed   By: AEddie CandleM.D.   On: 01/17/2019 11:32   Ct Abdomen Pelvis W Contrast  Result Date:  01/17/2019 CLINICAL DATA:  Restaging colon cancer EXAM: CT CHEST, ABDOMEN, AND PELVIS WITH CONTRAST TECHNIQUE: Multidetector CT imaging of the chest, abdomen and pelvis was performed following the standard protocol during bolus administration of intravenous contrast. CONTRAST:  148m OMNIPAQUE IOHEXOL 300 MG/ML SOLN, additional oral enteric contrast COMPARISON:  01/14/2018, 01/11/2017, 12/20/2015 FINDINGS: CT CHEST FINDINGS Cardiovascular: Aortic atherosclerosis. Normal heart size. Three-vessel coronary artery calcifications. No pericardial effusion. Mediastinum/Nodes: No enlarged mediastinal, hilar, or axillary lymph nodes. Thyroid gland, trachea, and esophagus demonstrate no significant findings. Lungs/Pleura: Mild paraseptal emphysema. Stable, benign 5 mm pulmonary nodule of the peripheral left lower lobe (series 6, image 98). Stable, benign 4 mm pulmonary nodule of the dependent right lower lobe (series 6, image 72). No pleural effusion or pneumothorax. Musculoskeletal: No chest wall mass or suspicious bone lesions identified. CT ABDOMEN PELVIS FINDINGS Hepatobiliary: No solid liver abnormality is seen. Hepatic steatosis. No gallstones, gallbladder wall thickening, or biliary dilatation. Pancreas: Unremarkable. No pancreatic ductal dilatation or surrounding inflammatory changes. Spleen: Normal in size without significant abnormality. Adrenals/Urinary Tract: Adrenal glands are unremarkable. Kidneys are normal, without renal calculi, solid lesion, or hydronephrosis. Thickening of the urinary bladder. Stomach/Bowel: Stomach is within normal limits. Redemonstrated postoperative findings of right colectomy. Sigmoid diverticulosis. Vascular/Lymphatic: No significant vascular findings are present. No enlarged abdominal or pelvic lymph nodes. Reproductive: Prostatomegaly. Other: No abdominal wall hernia or abnormality. No abdominopelvic ascites. Musculoskeletal: Redemonstrated wedge deformity of the L1 vertebral body.  IMPRESSION: 1. Stable postoperative findings of right colectomy. 2. No evidence of metastatic disease in the chest, abdomen, or pelvis. 3. Stable, benign small pulmonary nodules. 4. Three-vessel coronary artery calcifications. 5. Emphysema (ICD10-J43.9). 6. Aortic Atherosclerosis (ICD10-I70.0). Electronically Signed   By: AEddie CandleM.D.   On: 01/17/2019 11:32    Medications: I have reviewed the patient's current medications.   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, stage III (T3 N1), status post a laparoscopic right colectomy on 01/14/2016, 1/21 lymph nodes positive for metastatic carcinoma, lymphovascular and perineural invasion present  MSI-high with loss of MLH1 and PMS2 expression  No BRAFmutation  MLH1hypermethylation present  Cycle 1 adjuvant Xeloda 02/13/2016  Cycle 2 adjuvant Xeloda 03/06/2016  Cycle 3 adjuvant Xeloda 03/27/2016 (placed on hold 04/03/2016 due to mucositis)  Negative testing for DPD deficiency 05/04/2016  CTs 01/11/2017-negative for recurrent colon cancer  Colonoscopy 02/24/2017- hyperplastic polyp removed from the transverse colon  CTs 01/14/2018- no evidence of recurrent disease  CTs 01/17/2019-no evidence of recurrent disease  2. History of colonic polyps  3. Iron deficiency anemia-Resolved  4. History of coronary artery disease  5. Hand foot syndrome secondary to Xeloda-resolved  6. [facility] 7. [facility]    Disposition: Mr. MSchetteris in clinical remission from colon cancer.  He will return for an office visit and CEA in 6 months.  GBetsy Coder MD  01/17/2019  1:02 PM

## 2019-07-18 ENCOUNTER — Other Ambulatory Visit: Payer: Self-pay

## 2019-07-18 ENCOUNTER — Telehealth: Payer: Self-pay | Admitting: Oncology

## 2019-07-18 ENCOUNTER — Telehealth: Payer: Self-pay

## 2019-07-18 ENCOUNTER — Inpatient Hospital Stay: Payer: Federal, State, Local not specified - PPO

## 2019-07-18 ENCOUNTER — Inpatient Hospital Stay: Payer: Federal, State, Local not specified - PPO | Attending: Oncology | Admitting: Oncology

## 2019-07-18 VITALS — BP 133/65 | HR 75 | Temp 98.1°F | Resp 16 | Ht 66.5 in | Wt 164.2 lb

## 2019-07-18 DIAGNOSIS — Z9221 Personal history of antineoplastic chemotherapy: Secondary | ICD-10-CM | POA: Insufficient documentation

## 2019-07-18 DIAGNOSIS — Z85038 Personal history of other malignant neoplasm of large intestine: Secondary | ICD-10-CM | POA: Diagnosis present

## 2019-07-18 DIAGNOSIS — C189 Malignant neoplasm of colon, unspecified: Secondary | ICD-10-CM | POA: Diagnosis not present

## 2019-07-18 DIAGNOSIS — I2510 Atherosclerotic heart disease of native coronary artery without angina pectoris: Secondary | ICD-10-CM | POA: Insufficient documentation

## 2019-07-18 LAB — CEA (IN HOUSE-CHCC): CEA (CHCC-In House): 1.22 ng/mL (ref 0.00–5.00)

## 2019-07-18 NOTE — Progress Notes (Signed)
  [place] OFFICE PROGRESS NOTE   Diagnosis: Colon cancer  INTERVAL HISTORY:   Mr. Menken returns as scheduled.  He feels well.  Good appetite.  He relates weight loss to his activity level.  He has received the COVID-19 vaccine.  Objective:  Vital signs in last 24 hours:  Blood pressure 133/65, pulse 75, temperature 98.1 F (36.7 C), temperature source Temporal, resp. rate 16, height 5' 6.5" (1.689 m), weight 164 lb 3.2 oz (74.5 kg), SpO2 100 %.    HEENT: Neck without mass Lymphatics: No cervical, supraclavicular, axillary, or inguinal nodes Resp: Lungs clear bilaterally Cardio: Regular rate and rhythm GI: No hepatosplenomegaly, no mass, nontender Vascular: No leg edema   Lab Results:  Lab Results  Component Value Date   WBC 10.0 01/14/2018   HGB 15.8 01/14/2018   HCT 47.7 01/14/2018   MCV 92.6 01/14/2018   PLT 245 01/14/2018   NEUTROABS 5.1 01/14/2018    CMP  Lab Results  Component Value Date   NA 141 01/17/2019   K 4.3 01/17/2019   CL 102 01/17/2019   CO2 29 01/17/2019   GLUCOSE 118 (H) 01/17/2019   BUN 14 01/17/2019   CREATININE 1.10 01/17/2019   CALCIUM 9.2 01/17/2019   PROT 7.3 01/14/2018   ALBUMIN 4.0 01/14/2018   AST 26 01/14/2018   ALT 34 01/14/2018   ALKPHOS 79 01/14/2018   BILITOT 0.8 01/14/2018   GFRNONAA >60 01/17/2019   GFRAA >60 01/17/2019    Lab Results  Component Value Date   CEA1 1.22 07/18/2019     Medications: I have reviewed the patient's current medications.   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, stage III (T3 N1), status post a laparoscopic right colectomy on 01/14/2016, 1/21 lymph nodes positive for metastatic carcinoma, lymphovascular and perineural invasion present  MSI-high with loss of MLH1 and PMS2 expression  No BRAFmutation  MLH1hypermethylation present  Cycle 1 adjuvant Xeloda 02/13/2016  Cycle 2 adjuvant Xeloda 03/06/2016  Cycle 3 adjuvant  Xeloda 03/27/2016 (placed on hold 04/03/2016 due to mucositis)  Negative testing for DPD deficiency 05/04/2016  CTs 01/11/2017-negative for recurrent colon cancer  Colonoscopy 02/24/2017- hyperplastic polyp removed from the transverse colon  CTs 01/14/2018- no evidence of recurrent disease  CTs 01/17/2019-no evidence of recurrent disease  2. History of colonic polyps  3. Iron deficiency anemia-Resolved  4. History of coronary artery disease  5. Hand foot syndrome secondary to Xeloda-resolved  6. Mucositis secondary to [facility]  7. Skin rash secondary to [facility]    Disposition: Mr. Wakeley is in clinical remission from colon cancer.  He will return for an office visit and CEA in 6 months.  He will discuss the indication for another surveillance colonoscopy with Dr. Hilarie Fredrickson.  He is due for a colonoscopy in January 2022.  Betsy Coder, MD  07/18/2019  10:33 AM

## 2019-07-18 NOTE — Telephone Encounter (Signed)
Scheduled appt per 6/8 los.  Printed calendar and avs. 

## 2019-07-18 NOTE — Telephone Encounter (Signed)
TC to pt per Dr Benay Spice to let him know that his  CEA is normal, follow-up as scheduled. Patient verbalized understanding.

## 2020-01-16 ENCOUNTER — Inpatient Hospital Stay: Payer: Federal, State, Local not specified - PPO | Attending: Oncology | Admitting: Oncology

## 2020-01-16 ENCOUNTER — Other Ambulatory Visit: Payer: Self-pay

## 2020-01-16 ENCOUNTER — Inpatient Hospital Stay: Payer: Federal, State, Local not specified - PPO

## 2020-01-16 ENCOUNTER — Telehealth: Payer: Self-pay

## 2020-01-16 VITALS — BP 134/77 | HR 75 | Temp 97.4°F | Resp 16 | Ht 66.5 in | Wt 157.7 lb

## 2020-01-16 DIAGNOSIS — Z23 Encounter for immunization: Secondary | ICD-10-CM | POA: Diagnosis not present

## 2020-01-16 DIAGNOSIS — C189 Malignant neoplasm of colon, unspecified: Secondary | ICD-10-CM

## 2020-01-16 DIAGNOSIS — Z85038 Personal history of other malignant neoplasm of large intestine: Secondary | ICD-10-CM | POA: Diagnosis present

## 2020-01-16 DIAGNOSIS — Z9221 Personal history of antineoplastic chemotherapy: Secondary | ICD-10-CM

## 2020-01-16 DIAGNOSIS — Z8719 Personal history of other diseases of the digestive system: Secondary | ICD-10-CM | POA: Diagnosis not present

## 2020-01-16 DIAGNOSIS — Z9049 Acquired absence of other specified parts of digestive tract: Secondary | ICD-10-CM | POA: Insufficient documentation

## 2020-01-16 DIAGNOSIS — I2510 Atherosclerotic heart disease of native coronary artery without angina pectoris: Secondary | ICD-10-CM

## 2020-01-16 LAB — CEA (IN HOUSE-CHCC): CEA (CHCC-In House): 1.66 ng/mL (ref 0.00–5.00)

## 2020-01-16 MED ORDER — INFLUENZA VAC A&B SA ADJ QUAD 0.5 ML IM PRSY
0.5000 mL | PREFILLED_SYRINGE | Freq: Once | INTRAMUSCULAR | Status: AC
  Administered 2020-01-16: 0.5 mL via INTRAMUSCULAR

## 2020-01-16 MED ORDER — INFLUENZA VAC A&B SA ADJ QUAD 0.5 ML IM PRSY
PREFILLED_SYRINGE | INTRAMUSCULAR | Status: AC
  Filled 2020-01-16: qty 0.5

## 2020-01-16 NOTE — Telephone Encounter (Signed)
Pt made aware of lan results

## 2020-01-16 NOTE — Progress Notes (Signed)
  [place] OFFICE PROGRESS NOTE   Diagnosis: Colon cancer  INTERVAL HISTORY:   Todd French returns for a scheduled visit.  He feels well.  He relates weight loss to working in his home.  Good appetite.  No difficulty with bowel function.  He continues to have dryness and skin desquamation at the left foot.  Objective:  Vital signs in last 24 hours:  Blood pressure 134/77, pulse 75, temperature (!) 97.4 F (36.3 C), temperature source Tympanic, resp. rate 16, height 5' 6.5" (1.689 m), weight 157 lb 11.2 oz (71.5 kg), SpO2 100 %.    Lymphatics: No cervical, supraclavicular, axillary, or inguinal nodes Resp: Lungs clear bilaterally Cardio: Regular rate and rhythm GI: No mass, no hepatosplenomegaly, nontender Vascular: No leg edema  Skin: Dryness with skin flaking at the sole of left foot     Lab Results:  Lab Results  Component Value Date   WBC 10.0 01/14/2018   HGB 15.8 01/14/2018   HCT 47.7 01/14/2018   MCV 92.6 01/14/2018   PLT 245 01/14/2018   NEUTROABS 5.1 01/14/2018    CMP  Lab Results  Component Value Date   NA 141 01/17/2019   K 4.3 01/17/2019   CL 102 01/17/2019   CO2 29 01/17/2019   GLUCOSE 118 (H) 01/17/2019   BUN 14 01/17/2019   CREATININE 1.10 01/17/2019   CALCIUM 9.2 01/17/2019   PROT 7.3 01/14/2018   ALBUMIN 4.0 01/14/2018   AST 26 01/14/2018   ALT 34 01/14/2018   ALKPHOS 79 01/14/2018   BILITOT 0.8 01/14/2018   GFRNONAA >60 01/17/2019   GFRAA >60 01/17/2019    Lab Results  Component Value Date   CEA1 1.22 07/18/2019    Medications: I have reviewed the patient's current medications.   Assessment/Plan: 1. 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, stage III (T3 N1), status post a laparoscopic right colectomy on 01/14/2016, 1/21 lymph nodes positive for metastatic carcinoma, lymphovascular and perineural invasion present  MSI-high with loss of MLH1 and PMS2 expression  No  BRAFmutation  MLH1hypermethylation present  Cycle 1 adjuvant Xeloda 02/13/2016  Cycle 2 adjuvant Xeloda 03/06/2016  Cycle 3 adjuvant Xeloda 03/27/2016 (placed on hold 04/03/2016 due to mucositis)  Negative testing for DPD deficiency 05/04/2016  CTs 01/11/2017-negative for recurrent colon cancer  Colonoscopy 02/24/2017- hyperplastic polyp removed from the transverse colon  CTs 01/14/2018- no evidence of recurrent disease  CTs 01/17/2019-no evidence of recurrent disease  2. History of colonic polyps  3. Iron deficiency anemia-Resolved  4. History of coronary artery disease  5. Hand foot syndrome secondary to Xeloda-resolved  6. Mucositis secondary to [facility]  7. Skin rash secondary to [facility]     Disposition: Todd French is in clinical remission from colon cancer.  We will follow up on the CEA from today.  He will return for an office visit and CEA in 8 months. He received an influenza vaccine today. Todd French is due for a colonoscopy in January 2022.  He will follow-up with his dermatologist for management of the left foot skin changes.  Betsy Coder, MD  01/16/2020  9:35 AM

## 2020-02-29 IMAGING — CT CT CHEST W/ CM
2 of 5 series · 13 of 36 positions shown, 16 images · IV contrast (APPLIED)
Comparison: 01/14/2018, 01/11/2017, 12/20/2015

CLINICAL DATA: Restaging colon cancer

EXAM:
CT CHEST, ABDOMEN, AND PELVIS WITH CONTRAST
TECHNIQUE: Multidetector CT imaging of the chest, abdomen and pelvis was
performed following the standard protocol during bolus
administration of intravenous contrast.
CONTRAST:  100mL OMNIPAQUE IOHEXOL 300 MG/ML SOLN, additional oral
enteric contrast

[Series 2: cap with · axial · 0.87mm/px · z∈[-847,-322]mm · 10 of 129 slices shown, 13 images]
[im 12/129  mediastinal]
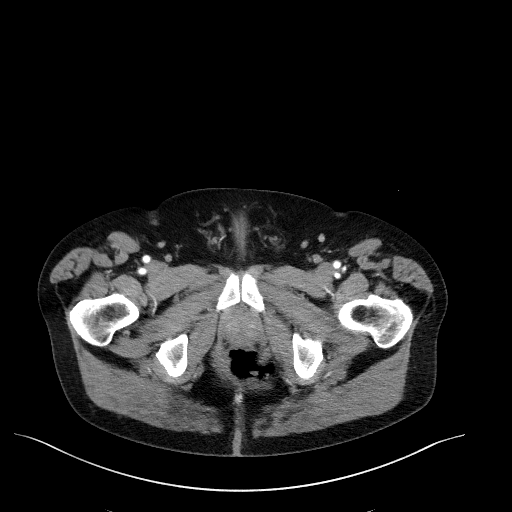
[im 12/129  lung]
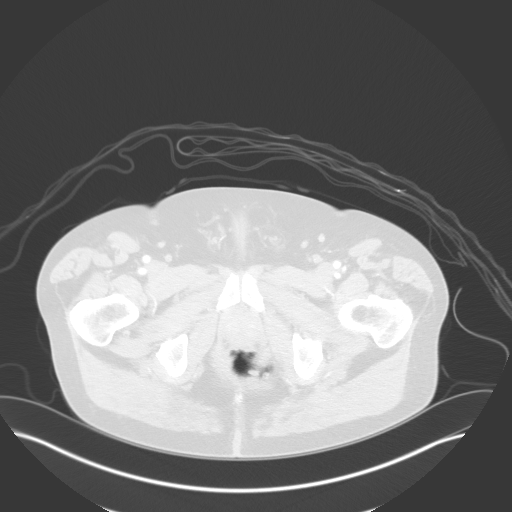
[im 24/129  lung]
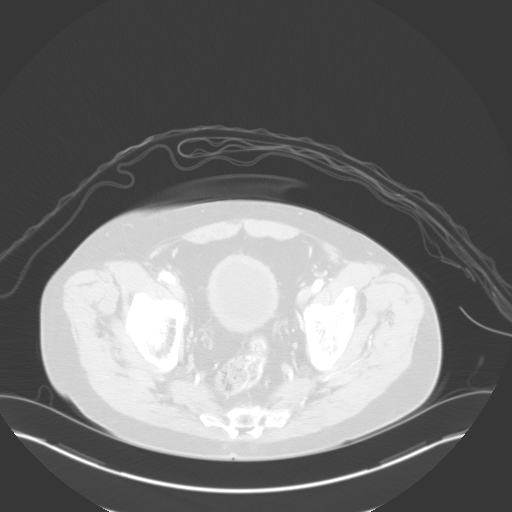
[im 35/129  lung]
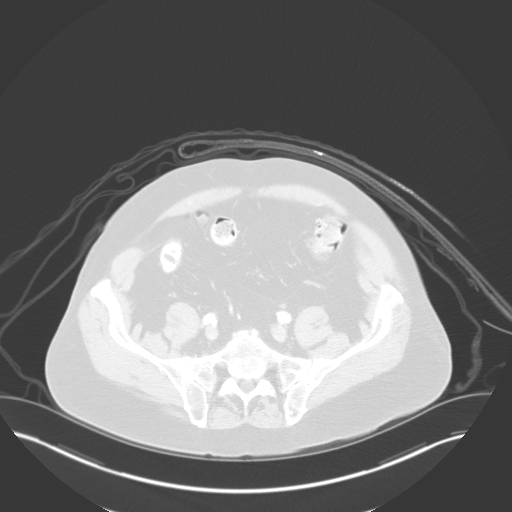
[im 47/129  lung]
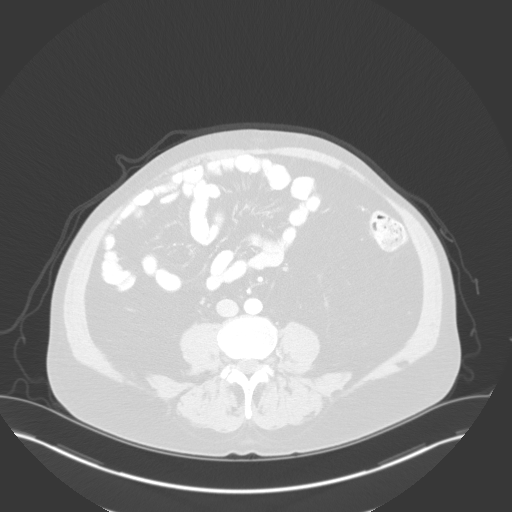
[im 59/129  mediastinal]
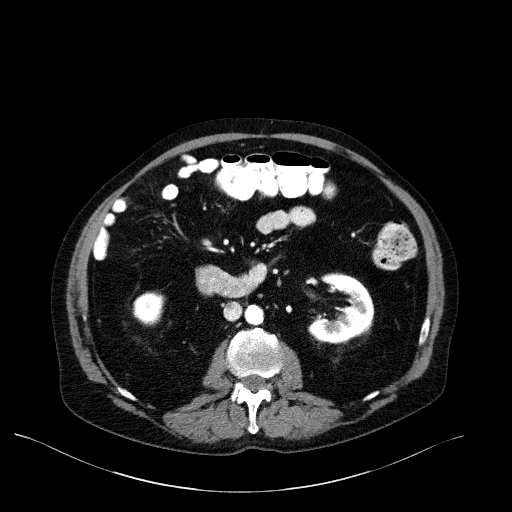
[im 59/129  lung]
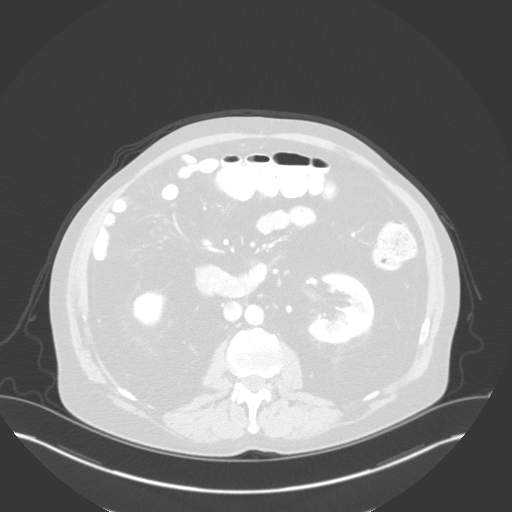
[im 70/129  lung]
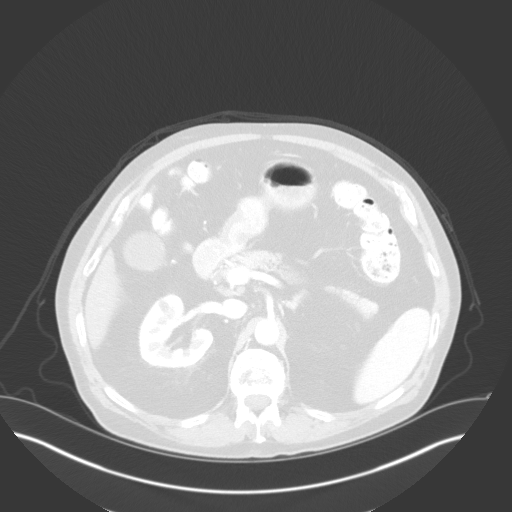
[im 82/129  lung]
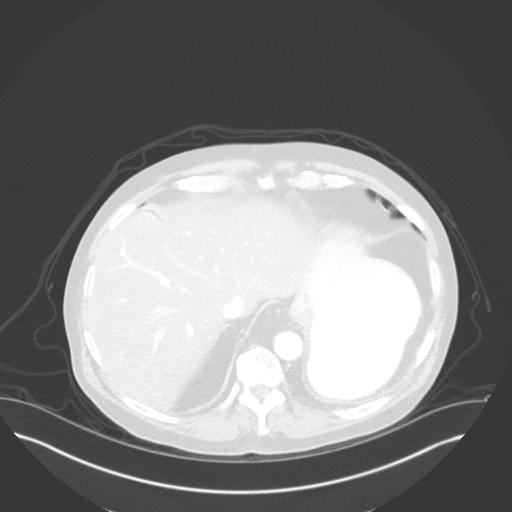
[im 94/129  lung]
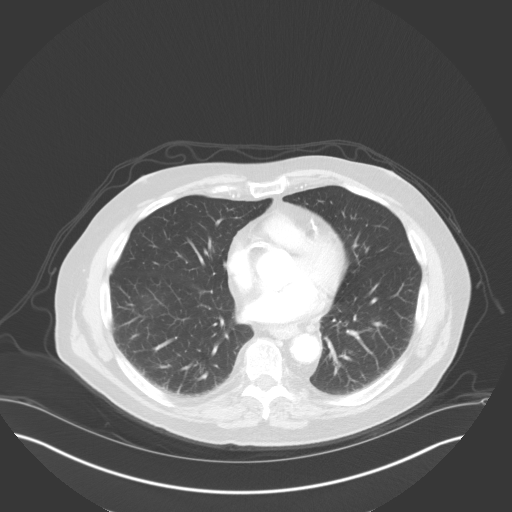
[im 105/129  mediastinal]
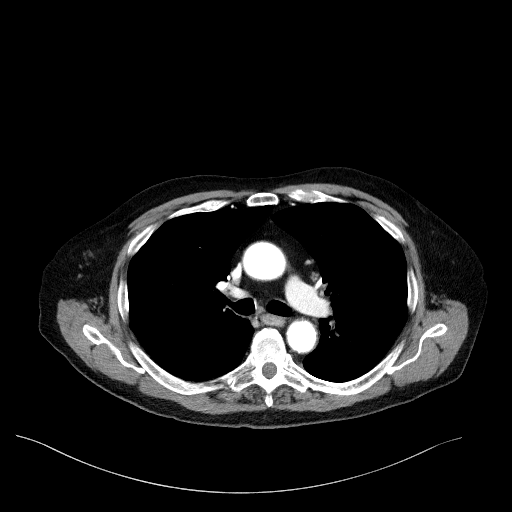
[im 105/129  lung]
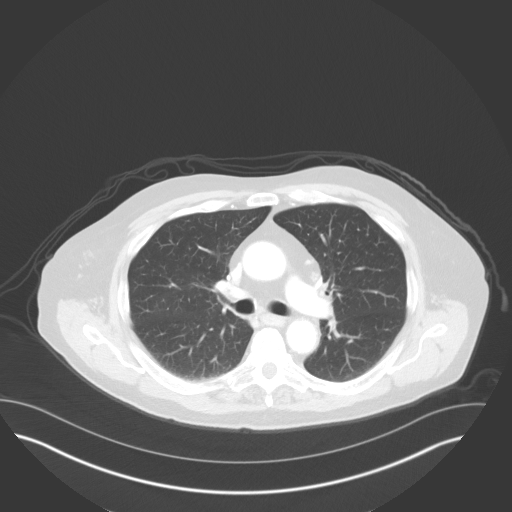
[im 117/129  lung]
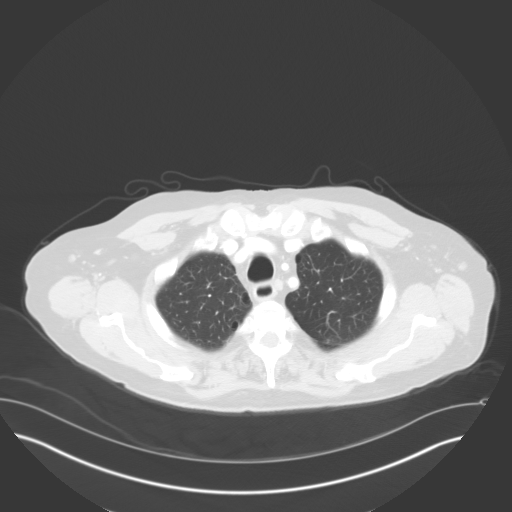

[Series 4: coronals · coronal · 0.78mm/px · 3 of 154 slices shown]
[im 31/154  lung]
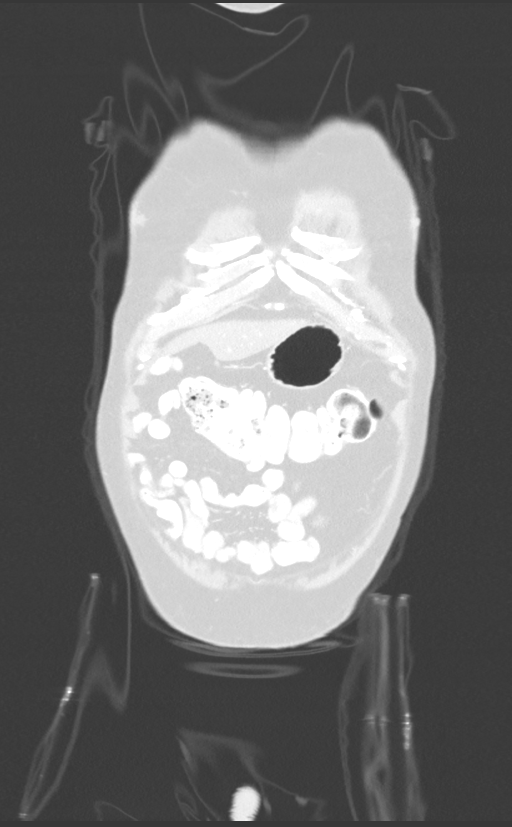
[im 62/154  lung]
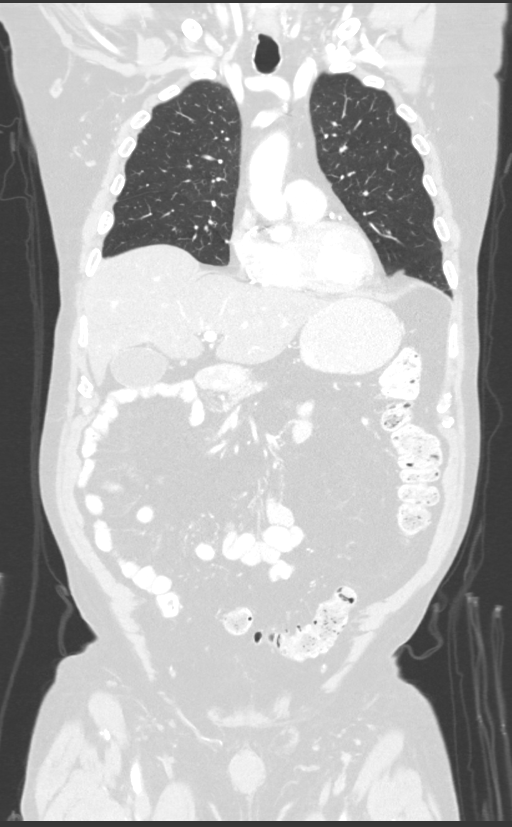
[im 92/154  lung]
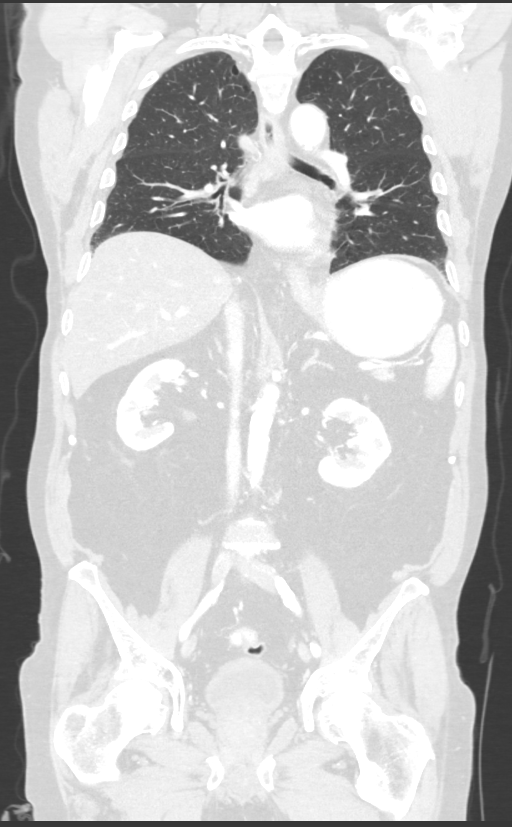

[13 of 36 positions shown; findings below may reference images not displayed]

FINDINGS: CT CHEST FINDINGS

Cardiovascular: Aortic atherosclerosis. Normal heart size.
Three-vessel coronary artery calcifications. No pericardial
effusion.

Mediastinum/Nodes: No enlarged mediastinal, hilar, or axillary lymph
nodes. Thyroid gland, trachea, and esophagus demonstrate no
significant findings.

Lungs/Pleura: Mild paraseptal emphysema. Stable, benign 5 mm
pulmonary nodule of the peripheral left lower lobe (series 6, image
98). Stable, benign 4 mm pulmonary nodule of the dependent right
lower lobe (series 6, image 72). No pleural effusion or
pneumothorax.

Musculoskeletal: No chest wall mass or suspicious bone lesions
identified.

CT ABDOMEN PELVIS FINDINGS

Hepatobiliary: No solid liver abnormality is seen. Hepatic
steatosis. No gallstones, gallbladder wall thickening, or biliary
dilatation.

Pancreas: Unremarkable. No pancreatic ductal dilatation or
surrounding inflammatory changes.

Spleen: Normal in size without significant abnormality.

Adrenals/Urinary Tract: Adrenal glands are unremarkable. Kidneys are
normal, without renal calculi, solid lesion, or hydronephrosis.
Thickening of the urinary bladder.

Stomach/Bowel: Stomach is within normal limits. Redemonstrated
postoperative findings of right colectomy. Sigmoid diverticulosis.

Vascular/Lymphatic: No significant vascular findings are present. No
enlarged abdominal or pelvic lymph nodes.

Reproductive: Prostatomegaly.

Other: No abdominal wall hernia or abnormality. No abdominopelvic
ascites.

Musculoskeletal: Redemonstrated wedge deformity of the L1 vertebral
body.
IMPRESSION: 1. Stable postoperative findings of right colectomy.
2. No evidence of metastatic disease in the chest, abdomen, or
pelvis.
3. Stable, benign small pulmonary nodules.
4. Three-vessel coronary artery calcifications.
5. Emphysema (QF7C2-X1D.J).
6. Aortic Atherosclerosis (QF7C2-YTW.W).

## 2020-03-27 ENCOUNTER — Encounter: Payer: Self-pay | Admitting: Internal Medicine

## 2020-03-27 ENCOUNTER — Telehealth: Payer: Self-pay | Admitting: Internal Medicine

## 2020-03-27 NOTE — Telephone Encounter (Signed)
Pre visit and colonoscopy scheduled.

## 2020-03-27 NOTE — Telephone Encounter (Signed)
Can schedule direct colon for personal hx of colon cancer He is following with Dr. Benay Spice and was seen 2 months ago.  Surveillance colonoscopy recommended at that time. Thanks [employer]

## 2020-05-01 ENCOUNTER — Ambulatory Visit (AMBULATORY_SURGERY_CENTER): Payer: Self-pay | Admitting: *Deleted

## 2020-05-01 ENCOUNTER — Other Ambulatory Visit: Payer: Self-pay

## 2020-05-01 VITALS — Ht 66.5 in | Wt 160.0 lb

## 2020-05-01 DIAGNOSIS — Z85038 Personal history of other malignant neoplasm of large intestine: Secondary | ICD-10-CM

## 2020-05-01 DIAGNOSIS — Z86010 Personal history of colonic polyps: Secondary | ICD-10-CM

## 2020-05-01 MED ORDER — SUTAB 1479-225-188 MG PO TABS: 1.0000 | ORAL_TABLET | ORAL | 0 refills | Status: DC

## 2020-05-01 NOTE — Progress Notes (Signed)
Patient and wife is here in-person for PV. Patient denies any allergies to eggs or soy. Patient denies any problems with anesthesia/sedation. Patient denies any oxygen use at home. Patient denies taking any diet/weight loss medications or blood thinners. Patient is not being treated for MRSA or C-diff. Patient is aware of our care-partner policy and HRVAC-45 safety protocol. Patient is fully COVID-19 vaccinated, per patient.   Prep Prescription coupon given to the patient.

## 2020-05-13 ENCOUNTER — Encounter: Payer: Self-pay | Admitting: Internal Medicine

## 2020-05-13 ENCOUNTER — Ambulatory Visit (AMBULATORY_SURGERY_CENTER): Payer: Federal, State, Local not specified - PPO | Admitting: Internal Medicine

## 2020-05-13 ENCOUNTER — Other Ambulatory Visit: Payer: Self-pay

## 2020-05-13 VITALS — BP 122/59 | HR 85 | Temp 97.2°F | Resp 13 | Ht 66.0 in | Wt 160.0 lb

## 2020-05-13 DIAGNOSIS — Z85038 Personal history of other malignant neoplasm of large intestine: Secondary | ICD-10-CM

## 2020-05-13 MED ORDER — SODIUM CHLORIDE 0.9 % IV SOLN: 500.0000 mL | Freq: Once | INTRAVENOUS | Status: DC

## 2020-05-13 NOTE — Progress Notes (Signed)
Report given to PACU, vss 

## 2020-05-13 NOTE — Progress Notes (Signed)
Pt's states no medical or surgical changes since previsit or office visit. 

## 2020-05-13 NOTE — Op Note (Signed)
[place] Patient Name: Todd French Procedure Date: 05/13/2020 2:19 PM MRN: [id number] Endoscopist: Jerene Bears , MD Age: 85 Referring MD:  Date of Birth: 1935/02/20 Gender: Male Account #: [id number] Procedure:                Colonoscopy Indications:              High risk colon cancer surveillance: Personal                            history of colon cancer removed by right                            hemi-colectomy in Dec 2017, Last colonoscopy:                            January 2019 Medicines:                Monitored Anesthesia Care Procedure:                Pre-Anesthesia Assessment:                           - Prior to the procedure, a History and Physical                            was performed, and patient medications and                            allergies were reviewed. The patient's tolerance of                            previous anesthesia was also reviewed. The risks                            and benefits of the procedure and the sedation                            options and risks were discussed with the patient.                            All questions were answered, and informed consent                            was obtained. Prior Anticoagulants: The patient has                            taken no previous anticoagulant or antiplatelet                            agents. ASA Grade Assessment: III - A patient with                            severe systemic disease. After reviewing the risks  and benefits, the patient was deemed in                            satisfactory condition to undergo the procedure.                           After obtaining informed consent, the colonoscope                            was passed under direct vision. Throughout the                            procedure, the patient's blood pressure, pulse, and                            oxygen saturations were monitored continuously. The                             Olympus [id number]) [id number] was introduced                            through the anus and advanced to the neo-terminal                            ileum. The colonoscopy was performed without                            difficulty. The patient tolerated the procedure                            well. The quality of the bowel preparation was                            good. The terminal ileum, ileocecal valve,                            appendiceal orifice, and rectum were photographed. Scope In: 2:28:58 PM Scope Out: 2:40:40 PM Scope Withdrawal Time: 0 hours 8 minutes 47 seconds  Total Procedure Duration: 0 hours 11 minutes 42 seconds  Findings:                 The digital rectal exam was normal.                           There was evidence of a prior end-to-side                            ileo-colonic anastomosis in the ascending colon.                            This was patent and was characterized by healthy                            appearing mucosa.  The neo-terminal ileum appeared normal.                           Multiple small-mouthed diverticula were found in                            the sigmoid colon and descending colon.                           The retroflexed view of the distal rectum and anal                            verge was normal and showed no anal or rectal                            abnormalities. Complications:            No immediate complications. Estimated Blood Loss:     Estimated blood loss: none. Impression:               - Patent end-to-side ileo-colonic anastomosis,                            characterized by healthy appearing mucosa.                           - The examined portion of the ileum was normal.                           - Diverticulosis in the sigmoid colon and in the                            descending colon.                           - The distal rectum and anal verge are normal on                             retroflexion view.                           - No specimens collected. Recommendation:           - Patient has a contact number available for                            emergencies. The signs and symptoms of potential                            delayed complications were discussed with the                            patient. Return to normal activities tomorrow.                            Written discharge instructions were provided to the  patient.                           - Resume previous diet.                           - Continue present medications.                           - Repeat colonoscopy would normally be recommended                            in 5 years for surveillance. This can be discussed                            in 5 years, but based on age at next surveillance                            interval may not be beneficial or in the patient's                            best interests. Jerene Bears, MD 05/13/2020 2:47:13 PM This report has been signed electronically.

## 2020-05-13 NOTE — Progress Notes (Signed)
C.W. vital signs. 

## 2020-05-13 NOTE — Patient Instructions (Signed)
Handout provided on diverticulosis.   YOU HAD AN ENDOSCOPIC PROCEDURE TODAY AT THE [facility]:   Refer to the procedure report that was given to you for any specific questions about what was found during the examination.  If the procedure report does not answer your questions, please call your gastroenterologist to clarify.  If you requested that your care partner not be given the details of your procedure findings, then the procedure report has been included in a sealed envelope for you to review at your convenience later.  YOU SHOULD EXPECT: Some feelings of bloating in the abdomen. Passage of more gas than usual.  Walking can help get rid of the air that was put into your GI tract during the procedure and reduce the bloating. If you had a lower endoscopy (such as a colonoscopy or flexible sigmoidoscopy) you may notice spotting of blood in your stool or on the toilet paper. If you underwent a bowel prep for your procedure, you may not have a normal bowel movement for a few days.  Please Note:  You might notice some irritation and congestion in your nose or some drainage.  This is from the oxygen used during your procedure.  There is no need for concern and it should clear up in a day or so.  SYMPTOMS TO REPORT IMMEDIATELY:  Following lower endoscopy (colonoscopy or flexible sigmoidoscopy):  Excessive amounts of blood in the stool  Significant tenderness or worsening of abdominal pains  Swelling of the abdomen that is new, acute  Fever of 100F or higher  For urgent or emergent issues, a gastroenterologist can be reached at any hour by calling [phone]. Do not use MyChart messaging for urgent concerns.    DIET:  We do recommend a small meal at first, but then you may proceed to your regular diet.  Drink plenty of fluids but you should avoid alcoholic beverages for 24 hours.  ACTIVITY:  You should plan to take it easy for the rest of today and you should NOT DRIVE or use  heavy machinery until tomorrow (because of the sedation medicines used during the test).    FOLLOW UP: Our staff will call the number listed on your records 48-72 hours following your procedure to check on you and address any questions or concerns that you may have regarding the information given to you following your procedure. If we do not reach you, we will leave a message.  We will attempt to reach you two times.  During this call, we will ask if you have developed any symptoms of COVID 19. If you develop any symptoms (ie: fever, flu-like symptoms, shortness of breath, cough etc.) before then, please call [phone].  If you test positive for Covid 19 in the 2 weeks post procedure, please call and report this information to us.    If any biopsies were taken you will be contacted by phone or by letter within the next 1-3 weeks.  Please call us at [phone] if you have not heard about the biopsies in 3 weeks.    SIGNATURES/CONFIDENTIALITY: You and/or your care partner have signed paperwork which will be entered into your electronic medical record.  These signatures attest to the fact that that the information above on your After Visit Summary has been reviewed and is understood.  Full responsibility of the confidentiality of this discharge information lies with you and/or your care-partner.  

## 2020-05-15 ENCOUNTER — Telehealth: Payer: Self-pay

## 2020-05-15 NOTE — Telephone Encounter (Signed)
Left message on 2nd follow up call. 

## 2020-05-15 NOTE — Telephone Encounter (Signed)
Left message on follow up call. 

## 2020-09-16 ENCOUNTER — Other Ambulatory Visit: Payer: Federal, State, Local not specified - PPO

## 2020-09-16 ENCOUNTER — Other Ambulatory Visit: Payer: Self-pay

## 2020-09-16 ENCOUNTER — Inpatient Hospital Stay: Payer: Federal, State, Local not specified - PPO | Attending: Oncology | Admitting: Oncology

## 2020-09-16 ENCOUNTER — Inpatient Hospital Stay: Payer: Federal, State, Local not specified - PPO

## 2020-09-16 VITALS — BP 130/85 | HR 74 | Temp 98.2°F | Resp 20 | Ht 66.0 in | Wt 154.8 lb

## 2020-09-16 DIAGNOSIS — C189 Malignant neoplasm of colon, unspecified: Secondary | ICD-10-CM | POA: Diagnosis not present

## 2020-09-16 DIAGNOSIS — E611 Iron deficiency: Secondary | ICD-10-CM | POA: Diagnosis not present

## 2020-09-16 DIAGNOSIS — Z9049 Acquired absence of other specified parts of digestive tract: Secondary | ICD-10-CM | POA: Insufficient documentation

## 2020-09-16 DIAGNOSIS — Z9221 Personal history of antineoplastic chemotherapy: Secondary | ICD-10-CM | POA: Insufficient documentation

## 2020-09-16 DIAGNOSIS — Z85038 Personal history of other malignant neoplasm of large intestine: Secondary | ICD-10-CM | POA: Insufficient documentation

## 2020-09-16 LAB — CMP (CANCER CENTER ONLY)
ALT: 15 U/L (ref 0–44)
AST: 15 U/L (ref 15–41)
Albumin: 4 g/dL (ref 3.5–5.0)
Alkaline Phosphatase: 58 U/L (ref 38–126)
Anion gap: 8 (ref 5–15)
BUN: 18 mg/dL (ref 8–23)
CO2: 25 mmol/L (ref 22–32)
Calcium: 9.1 mg/dL (ref 8.9–10.3)
Chloride: 105 mmol/L (ref 98–111)
Creatinine: 0.79 mg/dL (ref 0.61–1.24)
GFR, Estimated: >60 mL/min (ref >60)
Glucose, Bld: 101 mg/dL — ABNORMAL HIGH (ref 70–99)
Potassium: 4.7 mmol/L (ref 3.5–5.1)
Sodium: 138 mmol/L (ref 135–145)
Total Bilirubin: 0.5 mg/dL (ref 0.3–1.2)
Total Protein: 6.6 g/dL (ref 6.5–8.1)

## 2020-09-16 LAB — CBC WITH DIFFERENTIAL (CANCER CENTER ONLY)
Abs Immature Granulocytes: 0.05 10*3/uL (ref 0.00–0.07)
Basophils Absolute: 0 10*3/uL (ref 0.0–0.1)
Basophils Relative: 0 %
Eosinophils Absolute: 0.4 10*3/uL (ref 0.0–0.5)
Eosinophils Relative: 4 %
HCT: 41.7 % (ref 39.0–52.0)
Hemoglobin: 13.8 g/dL (ref 13.0–17.0)
Immature Granulocytes: 1 %
Lymphocytes Relative: 31 %
Lymphs Abs: 3.2 10*3/uL (ref 0.7–4.0)
MCH: 31.3 pg (ref 26.0–34.0)
MCHC: 33.1 g/dL (ref 30.0–36.0)
MCV: 94.6 fL (ref 80.0–100.0)
Monocytes Absolute: 0.9 10*3/uL (ref 0.1–1.0)
Monocytes Relative: 9 %
Neutro Abs: 5.6 10*3/uL (ref 1.7–7.7)
Neutrophils Relative %: 55 %
Platelet Count: 181 10*3/uL (ref 150–400)
RBC: 4.41 MIL/uL (ref 4.22–5.81)
RDW: 14.1 % (ref 11.5–15.5)
WBC Count: 10.2 10*3/uL (ref 4.0–10.5)
nRBC: 0 % (ref 0.0–0.2)

## 2020-09-16 LAB — CEA (ACCESS): CEA (CHCC): <1 ng/mL (ref 0.00–5.00)

## 2020-09-16 LAB — CEA (IN HOUSE-CHCC): CEA (CHCC-In House): 1.37 ng/mL (ref 0.00–5.00)

## 2020-09-16 NOTE — Progress Notes (Signed)
  [place] OFFICE PROGRESS NOTE   Diagnosis: Colon cancer  INTERVAL HISTORY:   Todd French returns as scheduled.  He feels well.  He relates weight loss to working.  Good appetite.  He underwent a colonoscopy by Dr. Hilarie Fredrickson on 05/13/2020.  No polyps.  Objective:  Vital signs in last 24 hours:  Blood pressure 130/85, pulse 74, temperature 98.2 F (36.8 C), temperature source Oral, resp. rate 20, height $RemoveBe'5\' 6"'QbXvBqaVm$  (1.676 m), weight 154 lb 12.8 oz (70.2 kg), SpO2 100 %.     Lymphatics: No cervical, supraclavicular, axillary, or inguinal nodes Resp: Lungs clear bilaterally Cardio: Regular rate and rhythm GI: No hepatosplenomegaly, no mass, nontender Vascular: No leg edema   Lab Results:  Lab Results  Component Value Date   WBC 10.2 09/16/2020   HGB 13.8 09/16/2020   HCT 41.7 09/16/2020   MCV 94.6 09/16/2020   PLT 181 09/16/2020   NEUTROABS 5.6 09/16/2020      Medications: I have reviewed the patient's current medications.   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, stage III (T3 N1), status post a laparoscopic right colectomy on 01/14/2016, 1/21 lymph nodes positive for metastatic carcinoma, lymphovascular and perineural invasion present MSI-high with loss of MLH1 and PMS2 expression No BRAF mutation MLH1 hypermethylation present Cycle 1 adjuvant Xeloda 02/13/2016 Cycle 2 adjuvant Xeloda 03/06/2016 Cycle 3 adjuvant Xeloda 03/27/2016 (placed on hold 04/03/2016 due to mucositis) Negative testing for DPD deficiency 05/04/2016 CTs 01/11/2017-negative for recurrent colon cancer Colonoscopy 02/24/2017- hyperplastic polyp removed from the transverse colon CTs 01/14/2018- no evidence of recurrent disease CTs 01/17/2019-no evidence of recurrent disease Colonoscopy 05/13/2020-no polyp   2.   History of colonic polyps   3.   Iron deficiency anemia-Resolved   4.   History of coronary artery disease   5.   Hand foot syndrome  secondary to Xeloda-resolved   6.   Mucositis secondary to [facility]   7.   Skin rash secondary to [facility]      Disposition: Todd French remains in clinical remission from colon cancer.  We will follow-up on the CEA from today.  He will be 5 years out from diagnosis in December.  He would like to continue follow-up at the Cancer center.  Todd French will return for an office visit in 9 months.  Betsy Coder, MD  09/16/2020  9:43 AM

## 2020-09-17 ENCOUNTER — Telehealth: Payer: Self-pay

## 2020-09-17 NOTE — Telephone Encounter (Signed)
Called and made pt aware of most recent CEA results no new orders no concerns f/u as scheduled

## 2020-09-17 NOTE — Telephone Encounter (Signed)
-----   Message from Ladell Pier, MD sent at 09/16/2020  1:19 PM EDT ----- Please call patient, CEA is normal, follow-up as scheduled

## 2021-06-16 ENCOUNTER — Inpatient Hospital Stay: Payer: Federal, State, Local not specified - PPO | Attending: Oncology | Admitting: Oncology

## 2021-06-16 ENCOUNTER — Encounter: Payer: Self-pay | Admitting: Oncology

## 2021-06-16 VITALS — BP 130/64 | HR 88 | Temp 98.1°F | Resp 20 | Ht 66.0 in | Wt 150.6 lb

## 2021-06-16 DIAGNOSIS — Z9221 Personal history of antineoplastic chemotherapy: Secondary | ICD-10-CM | POA: Diagnosis not present

## 2021-06-16 DIAGNOSIS — C189 Malignant neoplasm of colon, unspecified: Secondary | ICD-10-CM

## 2021-06-16 DIAGNOSIS — I2510 Atherosclerotic heart disease of native coronary artery without angina pectoris: Secondary | ICD-10-CM | POA: Insufficient documentation

## 2021-06-16 DIAGNOSIS — Z85038 Personal history of other malignant neoplasm of large intestine: Secondary | ICD-10-CM | POA: Diagnosis present

## 2021-06-16 NOTE — Progress Notes (Signed)
?  [facility] ?OFFICE PROGRESS NOTE ? ? ?Diagnosis: Colon cancer ? ?INTERVAL HISTORY:  ? ?Todd French returns as scheduled.  He feels well.  Good appetite.  He reports 3 episodes of acute nausea/vomiting and diarrhea over the past several months.  No bleeding.  The symptoms spontaneously resolve after a few days.  He was evaluated in urgent care.  No fever.  He lost weight following the episodes of diarrhea.  He is now gaining weight. ? ?Objective: ? ?Vital signs in last 24 hours: ? ?Blood pressure 130/64, pulse 88, temperature 98.1 ?F (36.7 ?C), temperature source Oral, resp. rate 20, height $RemoveBe'5\' 6"'rlnxHrMxi$  (1.676 m), weight 150 lb 9.6 oz (68.3 kg), SpO2 98 %. ?  ? ?Lymphatics: No cervical, supraclavicular, axillary, or inguinal nodes ?Resp: Lungs clear bilaterally ?Cardio: Regular rate and rhythm ?GI: No hepatosplenomegaly, no mass, nontender ?Vascular: No leg edema ? ? ?Lab Results: ? ?Lab Results  ?Component Value Date  ? WBC 10.2 09/16/2020  ? HGB 13.8 09/16/2020  ? HCT 41.7 09/16/2020  ? MCV 94.6 09/16/2020  ? PLT 181 09/16/2020  ? NEUTROABS 5.6 09/16/2020  ? ? ?CMP  ?Lab Results  ?Component Value Date  ? NA 138 09/16/2020  ? K 4.7 09/16/2020  ? CL 105 09/16/2020  ? CO2 25 09/16/2020  ? GLUCOSE 101 (H) 09/16/2020  ? BUN 18 09/16/2020  ? CREATININE 0.79 09/16/2020  ? CALCIUM 9.1 09/16/2020  ? PROT 6.6 09/16/2020  ? ALBUMIN 4.0 09/16/2020  ? AST 15 09/16/2020  ? ALT 15 09/16/2020  ? ALKPHOS 58 09/16/2020  ? BILITOT 0.5 09/16/2020  ? GFRNONAA >60 09/16/2020  ? GFRAA >60 01/17/2019  ? ? ?Lab Results  ?Component Value Date  ? CEA1 1.37 09/16/2020  ? CEA <1.00 09/16/2020  ? ? ?Medications: I have reviewed the patient's current medications. ? ? 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, stage III (T3 N1), status post a laparoscopic right colectomy on 01/14/2016, 1/21 lymph nodes positive for metastatic carcinoma, lymphovascular and perineural invasion present ?MSI-high with  loss of MLH1 and PMS2 expression ?No BRAF mutation ?MLH1 hypermethylation present ?Cycle 1 adjuvant Xeloda 02/13/2016 ?Cycle 2 adjuvant Xeloda 03/06/2016 ?Cycle 3 adjuvant Xeloda 03/27/2016 (placed on hold 04/03/2016 due to mucositis) ?Negative testing for DPD deficiency 05/04/2016 ?CTs 01/11/2017-negative for recurrent colon cancer ?Colonoscopy 02/24/2017- hyperplastic polyp removed from the transverse colon ?CTs 01/14/2018- no evidence of recurrent disease ?CTs 01/17/2019-no evidence of recurrent disease ?Colonoscopy 05/13/2020-no polyp ?  ?2.   History of colonic polyps ?  ?3.   Iron deficiency anemia-Resolved ?  ?4.   History of coronary artery disease ?  ?5.   Hand foot syndrome secondary to [facility] ?  ?6.   Mucositis secondary to [facility] ?  ?7.   Skin rash secondary to [facility] ?  ? ? ? ?Disposition: ?Todd French is in clinical remission from colon cancer.  He will return for an office visit in 8 months.  He will call in the interim for new symptoms. ?I recommended he follow-up with Dr. Hilarie Fredrickson if he has recurrent diarrhea. ? ?Todd Coder, MD ? ?06/16/2021  ?8:36 AM ? ? ?

## 2022-02-16 ENCOUNTER — Inpatient Hospital Stay: Payer: Federal, State, Local not specified - PPO | Attending: Oncology | Admitting: Oncology

## 2022-02-16 VITALS — BP 128/70 | HR 80 | Temp 98.1°F | Resp 18 | Ht 66.0 in | Wt 145.6 lb

## 2022-02-16 DIAGNOSIS — I2510 Atherosclerotic heart disease of native coronary artery without angina pectoris: Secondary | ICD-10-CM | POA: Diagnosis not present

## 2022-02-16 DIAGNOSIS — Z85038 Personal history of other malignant neoplasm of large intestine: Secondary | ICD-10-CM | POA: Insufficient documentation

## 2022-02-16 DIAGNOSIS — Z9221 Personal history of antineoplastic chemotherapy: Secondary | ICD-10-CM | POA: Diagnosis not present

## 2022-02-16 DIAGNOSIS — C189 Malignant neoplasm of colon, unspecified: Secondary | ICD-10-CM

## 2022-02-16 NOTE — Progress Notes (Signed)
  [place] OFFICE PROGRESS NOTE   Diagnosis: Colon cancer  INTERVAL HISTORY:   Mr. Todd French returns as scheduled.  He feels well.  Good appetite.  He relates weight loss to hospitalization in October with an esophageal food impaction and a subsequent diarrhea illness in December.  He also reports weight loss due to "dentures ".  He reports undergoing an upper endoscopy.  Diarrhea has resolved.  Objective:  Vital signs in last 24 hours:  Blood pressure 128/70, pulse 80, temperature 98.1 F (36.7 C), temperature source Oral, resp. rate 18, height '5\' 6"'$  (1.676 m), weight 145 lb 9.6 oz (66 kg), SpO2 98 %.    HEENT: No thrush, erythema of the pharynx and left buccal mucosa Lymphatics: No cervical, supraclavicular, axillary, or inguinal nodes Resp: Lungs clear bilaterally, no respiratory distress Cardio: Regular rate and rhythm GI: No hepatosplenomegaly, no mass, nontender Vascular: No leg edema   Lab Results:  Lab Results  Component Value Date   WBC 10.2 09/16/2020   HGB 13.8 09/16/2020   HCT 41.7 09/16/2020   MCV 94.6 09/16/2020   PLT 181 09/16/2020   NEUTROABS 5.6 09/16/2020    CMP  Lab Results  Component Value Date   NA 138 09/16/2020   K 4.7 09/16/2020   CL 105 09/16/2020   CO2 25 09/16/2020   GLUCOSE 101 (H) 09/16/2020   BUN 18 09/16/2020   CREATININE 0.79 09/16/2020   CALCIUM 9.1 09/16/2020   PROT 6.6 09/16/2020   ALBUMIN 4.0 09/16/2020   AST 15 09/16/2020   ALT 15 09/16/2020   ALKPHOS 58 09/16/2020   BILITOT 0.5 09/16/2020   GFRNONAA >60 09/16/2020   GFRAA >60 01/17/2019    Lab Results  Component Value Date   CEA1 1.37 09/16/2020   CEA <1.00 09/16/2020    Medications: I have reviewed the patient's current medications.   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, stage III (T3 N1), status post a laparoscopic right colectomy on 01/14/2016, 1/21 lymph nodes positive for metastatic carcinoma,  lymphovascular and perineural invasion present MSI-high with loss of MLH1 and PMS2 expression No BRAF mutation MLH1 hypermethylation present Cycle 1 adjuvant Xeloda 02/13/2016 Cycle 2 adjuvant Xeloda 03/06/2016 Cycle 3 adjuvant Xeloda 03/27/2016 (placed on hold 04/03/2016 due to mucositis) Negative testing for DPD deficiency 05/04/2016 CTs 01/11/2017-negative for recurrent colon cancer Colonoscopy 02/24/2017- hyperplastic polyp removed from the transverse colon CTs 01/14/2018- no evidence of recurrent disease CTs 01/17/2019-no evidence of recurrent disease Colonoscopy 05/13/2020-no polyp   2.   History of colonic polyps   3.   Iron deficiency anemia-Resolved   4.   History of coronary artery disease   5.   Hand foot syndrome secondary to Xeloda-resolved   6.   Mucositis secondary to [facility]   7.   Skin rash secondary to [facility]  8.   Report of hospital admission in [facility] October 2023 with an esophageal food impaction       Disposition: Mr. Hamada is in clinical remission from colon cancer.  He would like continue follow-up at the Cancer center.  He will return for an office visit in 8 months.  He will call in the interim for new symptoms.  Betsy Coder, MD  02/16/2022  8:43 AM

## 2022-10-16 ENCOUNTER — Telehealth: Payer: Self-pay | Admitting: *Deleted

## 2022-10-16 ENCOUNTER — Telehealth: Payer: Self-pay

## 2022-10-16 ENCOUNTER — Inpatient Hospital Stay: Payer: Federal, State, Local not specified - PPO | Attending: Oncology | Admitting: Oncology

## 2022-10-16 ENCOUNTER — Encounter: Payer: Self-pay | Admitting: Oncology

## 2022-10-16 ENCOUNTER — Inpatient Hospital Stay: Payer: Federal, State, Local not specified - PPO

## 2022-10-16 VITALS — BP 133/64 | HR 69 | Temp 98.1°F | Resp 18 | Ht 66.0 in | Wt 145.0 lb

## 2022-10-16 DIAGNOSIS — C189 Malignant neoplasm of colon, unspecified: Secondary | ICD-10-CM

## 2022-10-16 DIAGNOSIS — D509 Iron deficiency anemia, unspecified: Secondary | ICD-10-CM | POA: Diagnosis not present

## 2022-10-16 DIAGNOSIS — Z9049 Acquired absence of other specified parts of digestive tract: Secondary | ICD-10-CM | POA: Diagnosis not present

## 2022-10-16 DIAGNOSIS — Z85038 Personal history of other malignant neoplasm of large intestine: Secondary | ICD-10-CM | POA: Insufficient documentation

## 2022-10-16 LAB — CBC WITH DIFFERENTIAL (CANCER CENTER ONLY)
Abs Immature Granulocytes: 0.03 10*3/uL (ref 0.00–0.07)
Basophils Absolute: 0 10*3/uL (ref 0.0–0.1)
Basophils Relative: 0 %
Eosinophils Absolute: 0.3 10*3/uL (ref 0.0–0.5)
Eosinophils Relative: 3 %
HCT: 39.5 % (ref 39.0–52.0)
Hemoglobin: 13.3 g/dL (ref 13.0–17.0)
Immature Granulocytes: 0 %
Lymphocytes Relative: 36 %
Lymphs Abs: 3.5 10*3/uL (ref 0.7–4.0)
MCH: 31 pg (ref 26.0–34.0)
MCHC: 33.7 g/dL (ref 30.0–36.0)
MCV: 92.1 fL (ref 80.0–100.0)
Monocytes Absolute: 0.8 10*3/uL (ref 0.1–1.0)
Monocytes Relative: 8 %
Neutro Abs: 5 10*3/uL (ref 1.7–7.7)
Neutrophils Relative %: 53 %
Platelet Count: 218 10*3/uL (ref 150–400)
RBC: 4.29 MIL/uL (ref 4.22–5.81)
RDW: 14.5 % (ref 11.5–15.5)
WBC Count: 9.6 10*3/uL (ref 4.0–10.5)
nRBC: 0 % (ref 0.0–0.2)

## 2022-10-16 LAB — FERRITIN: Ferritin: 123 ng/mL (ref 24–336)

## 2022-10-16 NOTE — Telephone Encounter (Signed)
Patient gave verbal understanding and had no further questions or concerns  

## 2022-10-16 NOTE — Telephone Encounter (Signed)
Informed Todd French of normal CBC/ferritin results.

## 2022-10-16 NOTE — Progress Notes (Signed)
  [facility] OFFICE PROGRESS NOTE   Diagnosis: Colon cancer  INTERVAL HISTORY:   Todd French returns as scheduled.  He feels well.  Appetite.  He has frequent bowel movements.  No diarrhea or bleeding. A CBC [facility] on 10/08/2022 found the hemoglobin at 13.4 (13.5-17.5), hematocrit 41.1%, MCV 96, RDW 14.8 (12-14.6), platelets 210,000, WBC 8.49, ANC 4.16. Objective:  Vital signs in last 24 hours:  Blood pressure 133/64, pulse 69, temperature 98.1 F (36.7 C), temperature source Oral, resp. rate 18, height 5\' 6"  (1.676 m), weight 145 lb (65.8 kg), SpO2 98%.    Lymphatics: No cervical, supraclavicular, axillary, or inguinal nodes Resp: Lungs clear bilaterally Cardio: Regular rate and rhythm GI: Nontender, no mass, no hepatosplenomegaly Vascular: No leg edema   Lab Results:  Lab Results  Component Value Date   WBC 10.2 09/16/2020   HGB 13.8 09/16/2020   HCT 41.7 09/16/2020   MCV 94.6 09/16/2020   PLT 181 09/16/2020   NEUTROABS 5.6 09/16/2020    CMP  Lab Results  Component Value Date   NA 138 09/16/2020   K 4.7 09/16/2020   CL 105 09/16/2020   CO2 25 09/16/2020   GLUCOSE 101 (H) 09/16/2020   BUN 18 09/16/2020   CREATININE 0.79 09/16/2020   CALCIUM 9.1 09/16/2020   PROT 6.6 09/16/2020   ALBUMIN 4.0 09/16/2020   AST 15 09/16/2020   ALT 15 09/16/2020   ALKPHOS 58 09/16/2020   BILITOT 0.5 09/16/2020   GFRNONAA >60 09/16/2020   GFRAA >60 01/17/2019    Lab Results  Component Value Date   CEA1 1.37 09/16/2020   CEA <1.00 09/16/2020    No results found for: "INR", "LABPROT"  Imaging:  No results found.  Medications: I have reviewed the patient's current medications.   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, stage III (T3 N1), status post a laparoscopic right colectomy on 01/14/2016, 1/21 lymph nodes positive for metastatic carcinoma, lymphovascular and perineural invasion  present MSI-high with loss of MLH1 and PMS2 expression No BRAF mutation MLH1 hypermethylation present Cycle 1 adjuvant Xeloda 02/13/2016 Cycle 2 adjuvant Xeloda 03/06/2016 Cycle 3 adjuvant Xeloda 03/27/2016 (placed on hold 04/03/2016 due to mucositis) Negative testing for DPD deficiency 05/04/2016 CTs 01/11/2017-negative for recurrent colon cancer Colonoscopy 02/24/2017- hyperplastic polyp removed from the transverse colon CTs 01/14/2018- no evidence of recurrent disease CTs 01/17/2019-no evidence of recurrent disease Colonoscopy 05/13/2020-no polyp   2.   History of colonic polyps   3.   Iron deficiency anemia-Resolved   4.   History of coronary artery disease   5.   Hand foot syndrome secondary to Xeloda-resolved   6.   Mucositis secondary to [facility]   7.   Skin rash secondary to [facility]  8.   Report of hospital admission in [facility] October 2023 with an esophageal food impaction        Disposition: Todd French is in clinical remission from colon cancer.  He would like to continue follow-up with the cancer center.  He will return for an office visit in 8 months.  A CBC last week found the hemoglobin to be mildly low.  Todd French will return to the lab for a repeat CBC and ferritin level today.  He has a history of iron deficiency anemia.  He will remain up-to-date on influenza, COVID-19, and pneumonia vaccines.  Thornton Papas, MD  10/16/2022  8:32 AM

## 2022-10-16 NOTE — Telephone Encounter (Signed)
-----   Message from Thornton Papas sent at 10/16/2022  3:42 PM EDT ----- Please call patient, CBC is normal, follow-up as scheduled

## 2023-06-17 ENCOUNTER — Inpatient Hospital Stay: Payer: Federal, State, Local not specified - PPO | Attending: Oncology | Admitting: Oncology

## 2023-06-17 ENCOUNTER — Encounter: Payer: Self-pay | Admitting: *Deleted

## 2023-06-17 NOTE — Progress Notes (Signed)
"  No show" for OV today. Scheduling message sent to reschedule for 1-2 months.

## 2023-06-18 ENCOUNTER — Telehealth: Payer: Self-pay | Admitting: Oncology

## 2023-06-18 NOTE — Telephone Encounter (Signed)
 Home phone number is out of service. LMOVM to RS No show appt 06/17/2023 in 2 months.

## 2023-07-19 ENCOUNTER — Telehealth: Payer: Self-pay | Admitting: Nurse Practitioner

## 2023-07-19 NOTE — Telephone Encounter (Signed)
 Confirmed rescheduled date and time with PT

## 2023-07-19 NOTE — Telephone Encounter (Signed)
 Wife called stating that PT is having some constipation, has a mild cough and some allergy symptoms.

## 2023-07-26 ENCOUNTER — Encounter: Payer: Self-pay | Admitting: Nurse Practitioner

## 2023-07-26 ENCOUNTER — Inpatient Hospital Stay

## 2023-07-26 ENCOUNTER — Inpatient Hospital Stay: Attending: Oncology | Admitting: Nurse Practitioner

## 2023-07-26 VITALS — BP 131/65 | HR 73 | Temp 98.1°F | Resp 18 | Ht 66.0 in | Wt 146.6 lb

## 2023-07-26 DIAGNOSIS — Z85038 Personal history of other malignant neoplasm of large intestine: Secondary | ICD-10-CM | POA: Insufficient documentation

## 2023-07-26 DIAGNOSIS — C189 Malignant neoplasm of colon, unspecified: Secondary | ICD-10-CM

## 2023-07-26 DIAGNOSIS — R194 Change in bowel habit: Secondary | ICD-10-CM | POA: Diagnosis not present

## 2023-07-26 LAB — CEA (ACCESS): CEA (CHCC): <1 ng/mL (ref 0.00–5.00)

## 2023-07-26 NOTE — Progress Notes (Signed)
  [facility] OFFICE PROGRESS NOTE   Diagnosis: Colon cancer   INTERVAL HISTORY:   Mr. Baylock returns for follow-up.  Several weeks ago he developed cold/allergy symptoms.  Around that same time he became constipated.  Since then bowels have been alternating mild constipation and diarrhea.  No blood with bowel movements.  No abdominal pain.  Overall good appetite.  Cold/allergy symptoms are better.  No fever or shortness of breath.  Cough slowly improving.  He was seen at a clinic in [place] on 07/14/2023.  Chest x-ray was negative.  He also had an abdominal x-ray.  Report not included in the records he brought with him to the office but the office note indicates there was no constipation on the xray.  Rectal exam noted soft stool in the rectal vault, negative for occult blood.  Objective:  Vital signs in last 24 hours:  Blood pressure 131/65, pulse 73, temperature 98.1 F (36.7 C), temperature source Temporal, resp. rate 18, height 5' 6 (1.676 m), weight 146 lb 9.6 oz (66.5 kg), SpO2 100%.    Lymphatics: No palpable cervical, supraclavicular, axillary or inguinal lymph nodes. Resp: Lungs clear bilaterally. Cardio: Regular rate and rhythm. GI: Abdomen soft and nontender.  No hepatosplenomegaly.  No mass. Vascular: No leg edema.   Lab Results:  Lab Results  Component Value Date   WBC 9.6 10/16/2022   HGB 13.3 10/16/2022   HCT 39.5 10/16/2022   MCV 92.1 10/16/2022   PLT 218 10/16/2022   NEUTROABS 5.0 10/16/2022    Imaging:  No results found.  Medications: I have reviewed the patient's current medications.  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, stage III (T3 N1), status post a laparoscopic right colectomy on 01/14/2016, 1/21 lymph nodes positive for metastatic carcinoma, lymphovascular and perineural invasion present MSI-high with loss of MLH1 and PMS2 expression No BRAF mutation MLH1 hypermethylation  present Cycle 1 adjuvant Xeloda  02/13/2016 Cycle 2 adjuvant Xeloda  03/06/2016 Cycle 3 adjuvant Xeloda  03/27/2016 (placed on hold 04/03/2016 due to mucositis) Negative testing for DPD deficiency 05/04/2016 CTs 01/11/2017-negative for recurrent colon cancer Colonoscopy 02/24/2017- hyperplastic polyp removed from the transverse colon CTs 01/14/2018- no evidence of recurrent disease CTs 01/17/2019-no evidence of recurrent disease Colonoscopy 05/13/2020-no polyp   2.   History of colonic polyps   3.   Iron deficiency anemia-Resolved   4.   History of coronary artery disease   5.   Hand foot syndrome secondary to Xeloda -resolved   6.   Mucositis secondary to Xeloda -resolved   7.   Skin rash secondary to Xeloda -resolved   8.   Report of hospital admission in [place]  October 2023 with an esophageal food impaction    Disposition: Mr. Lacock appears stable.  He is now close to 8 years out from the colon cancer diagnosis.  He will follow-up with Dr. Bridgett Camps regarding the recent change in bowel habits.  We will check a CEA today.  Cold symptoms are better.  Chest x-ray 07/14/2023 was negative.  He will continue to treat symptoms.  He understands to seek evaluation with worsening of current symptoms or fever, shortness of breath.  He will return for a follow-up appointment in 4 weeks.  We are available to see him sooner if needed.    Diana Forster ANP/GNP-BC   07/26/2023  10:05 AM

## 2023-07-27 ENCOUNTER — Telehealth: Payer: Self-pay

## 2023-07-27 NOTE — Telephone Encounter (Signed)
-----   Message from Diana Forster sent at 07/27/2023  9:12 AM EDT ----- Please let him know the CEA is stable in normal range.  Follow-up as scheduled.

## 2023-07-27 NOTE — Telephone Encounter (Signed)
 Spoke with Lona Rist patients wife about CEA results, will make patient aware. Understood, no further questions

## 2023-07-30 ENCOUNTER — Telehealth: Payer: Self-pay | Admitting: Internal Medicine

## 2023-07-30 NOTE — Telephone Encounter (Signed)
 Spoke to Mrs.Mcginnity who states that patient was recently seen by oncology who told him he needed to follow up with GI due to recent change in bowel habits and his previous history of colon cancer.  Mrs.Kiesel states that constipation started about 2 weeks ago following a cold. Patient was evaluated in [place] and abdominal xray did not show any stool burden. Heme negative stool on exam with soft stool in the rectal vault.   Patient denies any abdominal pain, rectal pain, rectal bleeding, or other GI changes.   Patient has been scheduled for a follow up with Everett Hitt, NP on 08/12/23 for completeness.  In the meantime, advised that patient eat foods high in fiber, increase water intake to at least 64 ounces per day and increase exercise/physical activity to aid in movement of stool through the colon. I have also recommended the addition of a fiber supplement (benefiber) daily, starting at 1 teaspoon and eventually working to 1 tablespoon daily. Mrs. domingo fuson understanding.

## 2023-07-30 NOTE — Telephone Encounter (Signed)
 Patients wife called and stated patient has been having constipation and was advised by oncologist he should have a colonoscopy. Wife is requesting a call back   Please advise  Thank you

## 2023-08-12 ENCOUNTER — Encounter: Payer: Self-pay | Admitting: Nurse Practitioner

## 2023-08-12 ENCOUNTER — Other Ambulatory Visit

## 2023-08-12 ENCOUNTER — Ambulatory Visit: Admitting: Nurse Practitioner

## 2023-08-12 VITALS — BP 120/54 | HR 84 | Ht 65.25 in | Wt 148.4 lb

## 2023-08-12 DIAGNOSIS — R194 Change in bowel habit: Secondary | ICD-10-CM | POA: Diagnosis not present

## 2023-08-12 DIAGNOSIS — K5900 Constipation, unspecified: Secondary | ICD-10-CM

## 2023-08-12 DIAGNOSIS — Z85038 Personal history of other malignant neoplasm of large intestine: Secondary | ICD-10-CM | POA: Diagnosis not present

## 2023-08-12 LAB — CBC
HCT: 38.6 % — ABNORMAL LOW (ref 39.0–52.0)
Hemoglobin: 12.8 g/dL — ABNORMAL LOW (ref 13.0–17.0)
MCHC: 33.1 g/dL (ref 30.0–36.0)
MCV: 92.8 fl (ref 78.0–100.0)
Platelets: 219 10*3/uL (ref 150.0–400.0)
RBC: 4.15 Mil/uL — ABNORMAL LOW (ref 4.22–5.81)
RDW: 15.8 % — ABNORMAL HIGH (ref 11.5–15.5)
WBC: 11.4 10*3/uL — ABNORMAL HIGH (ref 4.0–10.5)

## 2023-08-12 LAB — TSH: TSH: 1.24 u[IU]/mL (ref 0.35–5.50)

## 2023-08-12 NOTE — Patient Instructions (Signed)
 Your provider has requested that you go to the basement level for lab work before leaving today. Press B on the elevator. The lab is located at the first door on the left as you exit the elevator.  Continue Benefiber once daily.  Miralax  as needed at night time.  _______________________________________________________  If your blood pressure at your visit was 140/90 or greater, please contact your primary care physician to follow up on this.  _______________________________________________________  If you are age 45 or older, your body mass index should be between 23-30. Your Body mass index is 24.5 kg/m. If this is out of the aforementioned range listed, please consider follow up with your Primary Care Provider.  If you are age 30 or younger, your body mass index should be between 19-25. Your Body mass index is 24.5 kg/m. If this is out of the aformentioned range listed, please consider follow up with your Primary Care Provider.   ________________________________________________________  The [facility] providers would like to encourage you to use MYCHART to communicate with providers for non-urgent requests or questions.  Due to long hold times on the telephone, sending your provider a message by [facility] may be a faster and more efficient way to get a response.  Please allow 48 business hours for a response.  Please remember that this is for non-urgent requests.  _______________________________________________________   It was a pleasure to see you today!  Thank you for trusting me with your gastrointestinal care!

## 2023-08-12 NOTE — Progress Notes (Signed)
 08/12/2023 Todd French [id number] 11/29/1935   CHIEF COMPLAINT: Change in bowel pattern  HISTORY OF PRESENT ILLNESS: Todd French is an 88 year old male with a past medical history of hypertension, hyperlipidemia, coronary artery disease, GERD, diverticulitis and colon cancer treated with adjuvant chemo s/p right hemicolectomy 01/2016. He is known by Dr. Albertus. He presents to our office today as referred by Olam Ned oncology NP for further evaluation regarding a change in bowel pattern. He is accompanied by his wife. He developed a head cold and diarrhea 1 month ago. The diarrhea lasted for few days then he became constipated. No bloody diarrhea. No associated abdominal pain. He was evaluated by his PCP in [place] and abdominal x-ray did not show any significant stool burden and stool was Hemoccult negative on exam. He took a suppository which resulted in passing soft formed stools for the next few days. On Friday 6/20 he over ate and had a few episodes of diarrhea 07/2019 which abated. He contacted our office and was instructed to take Benefiber 1 tablespoon daily and his bowel pattern has continued to improve since then. He describes passing a normal formed brown stool daily for the past 3 days. He was seen by oncology NP Olam Ned 07/26/2023 and a CEA level  < 1.  His appetite is good. No weight loss. His most recent surveillance colonoscopy was 05/13/2020 which showed a patent end-to-side ileocolonic anastomosis, diverticulosis to the sigmoid and descending colon and no polyps. A repeat surveillance colonoscopy in 5 years to be considered, however, he will be age 19 years old at that point.  No GERD symptoms on Omeprazole 20 mg daily.      Latest Ref Rng & Units 10/16/2022    8:41 AM 09/16/2020    8:45 AM 01/14/2018   12:23 PM  CBC  WBC 4.0 - 10.5 K/uL 9.6  10.2  10.0   Hemoglobin 13.0 - 17.0 g/dL 86.6  86.1  84.1   Hematocrit 39.0 - 52.0 % 39.5  41.7  47.7   Platelets 150 - 400 K/uL 218   181  245     CEA < 1.0 on 07/26/2023.     Latest Ref Rng & Units 09/16/2020    8:45 AM 01/17/2019    8:22 AM 01/14/2018   12:23 PM  CMP  Glucose 70 - 99 mg/dL 898  881  893   BUN 8 - 23 mg/dL 18  14  16    Creatinine 0.61 - 1.24 mg/dL 9.20  8.89  8.99   Sodium 135 - 145 mmol/L 138  141  141   Potassium 3.5 - 5.1 mmol/L 4.7  4.3  4.8   Chloride 98 - 111 mmol/L 105  102  103   CO2 22 - 32 mmol/L 25  29  26    Calcium 8.9 - 10.3 mg/dL 9.1  9.2  9.8   Total Protein 6.5 - 8.1 g/dL 6.6   7.3   Total Bilirubin 0.3 - 1.2 mg/dL 0.5   0.8   Alkaline Phos 38 - 126 U/L 58   79   AST 15 - 41 U/L 15   26   ALT 0 - 44 U/L 15   34     Colonoscopy 05/13/2020: - Patent end-to-side ileo-colonic anastomosis, characterized by healthy appearing mucosa.  - The examined portion of the ileum was normal.  - Diverticulosis in the sigmoid colon and in the descending colon.  - The distal rectum and anal verge are normal on  retroflexion view.  - No specimens collected. - Consider a recall colonoscopy in 5 years  Past Medical History:  Diagnosis Date   Allergy    a lot drainage   Arthritis    hands and back   Bowel habit changes    Cancer (HCC) 12/16/2015   colon /finished chemo 03/2016   Cataract    Bil/ had surgery   Complication of anesthesia    'LITTLE HARD TO WAKE UP AT TIMES    Constipation    on and off   Diverticulitis    GERD (gastroesophageal reflux disease)    Hemorrhoids    History of detached retina repair    right eye   History of eye pain    right eye   Hyperlipidemia    Hypertension    Myocardial infarction (HCC) 09/1989   Angioplasty at [facility]   Pre-diabetes    Past Surgical History:  Procedure Laterality Date   bunion foot surgery Left    CATARACT EXTRACTION  06/2013   Bil   COLONOSCOPY  last 2019   x 3   CORONARY ANGIOPLASTY  1991   EYE SURGERY Bilateral 2015   ioc for catract   LAPAROSCOPIC PARTIAL COLECTOMY N/A 01/14/2016   Procedure: LAPAROSCOPIC PARTIAL  COLECTOMY;  Surgeon: Bernarda Ned, MD;  Location: [facility];  Service: General;  Laterality: N/A;   RETINAL DETACHMENT SURGERY  06/2015   right eye   ROTATOR CUFF REPAIR Right 1997   right shoulder   surgery for retached retina Right 06/2015, 2021   Social History: He is married.  Retired.  He served in [employer].  Past smoker quit smoking cigarettes 48 years ago. No alcohol use. No drug use.  Family History: Father with CVA.  Mother with Alzheimer's disease.  Daughter with colon polyps.  No Known Allergies    Outpatient Encounter Medications as of 08/12/2023  Medication Sig   acetaminophen  (TYLENOL ) 325 MG tablet Take 650 mg by mouth every 6 (six) hours as needed.   aspirin EC 81 MG tablet Take 81 mg by mouth daily. (Patient not taking: Reported on 07/26/2023)   atorvastatin (LIPITOR) 10 MG tablet Take 10 mg by mouth daily.   Bromfenac Sodium 0.09 % SOLN Place 1 drop into the left eye 2 (two) times daily. (Patient not taking: Reported on 07/26/2023)   carboxymethylcellulose (REFRESH PLUS) 0.5 % SOLN 1 drop 3 (three) times daily as needed. (Patient not taking: Reported on 07/26/2023)   fluticasone (FLONASE) 50 MCG/ACT nasal spray Place into both nostrils daily.   hydrochlorothiazide  (HYDRODIURIL ) 25 MG tablet TAKE ONE TABLET BY MOUTH DAILY WITH ENALAPRIL    loratadine -pseudoephedrine  (CLARITIN -D 24-HOUR) 10-240 MG 24 hr tablet Take 1 tablet by mouth daily.   omeprazole (PRILOSEC) 20 MG capsule Take 20 mg by mouth daily.   No facility-administered encounter medications on file as of 08/12/2023.   REVIEW OF SYSTEMS:  Gen: Denies fever, sweats or chills. No weight loss.  CV: Denies chest pain, palpitations or edema. Resp: + Cough.  GI: See HPI. GU: Denies urinary burning, blood in urine, increased urinary frequency or incontinence. MS: + Arthritis.  Derm: Denies rash, itchiness, skin lesions or unhealing ulcers. Psych: Denies depression, anxiety, memory loss or confusion. Heme: Denies  bruising, easy bleeding. Neuro:  Denies headaches, dizziness or paresthesias. Endo:  Denies any problems with DM, thyroid or adrenal function.  PHYSICAL EXAM: BP (!) 120/54 (BP Location: Left Arm, Patient Position: Sitting, Cuff Size: Normal)   Pulse 84  Ht 5' 5.25 (1.657 m) Comment: height measured without shoes  Wt 148 lb 6 oz (67.3 kg)   BMI 24.50 kg/m   Wt Readings from Last 3 Encounters:  08/12/23 148 lb 6 oz (67.3 kg)  07/26/23 146 lb 9.6 oz (66.5 kg)  10/16/22 145 lb (65.8 kg)    General: Delightful 88 year old male in no acute distress. Head: Normocephalic and atraumatic. Eyes:  Sclerae non-icteric, conjunctive pink. Ears: Normal auditory acuity. Mouth: Upper dentures. No ulcers or lesions.  Neck: Supple, no lymphadenopathy or thyromegaly.  Lungs: Clear bilaterally to auscultation without wheezes, crackles or rhonchi. Heart: Slightly irregular rhythm. No murmur, rub or gallop appreciated.  Abdomen: Soft, nontender, nondistended. No masses. No hepatosplenomegaly. Normoactive bowel sounds x 4 quadrants.  Abdominal midline scar intact. Rectal: Deferred. Musculoskeletal: Symmetrical with no gross deformities. Skin: Warm and dry. No rash or lesions on visible extremities. Extremities: No edema. Neurological: Alert oriented x 4, no focal deficits.  Psychological: Alert and cooperative. Normal mood and affect.  ASSESSMENT AND PLAN:  88 year old male with a history of colon cancer status post adjuvant chemotherapy and right hemicolectomy 01/2016 who presents with change of bowel pattern x 1 month, initially had nonbloody diarrhea for a few days followed by constipation which resolved after taking a suppository and initiating Benefiber daily. No abdominal pain. CEA level was normal.  His most recent surveillance colonoscopy 05/2020 showed patent end-to-side ileocolonic anastomosis, no polyps. -CBC, TSH - Patient to continue Benefiber 1 tablespoon daily - MiraLAX  nightly as  needed - Increase water intake to 6 glasses daily - Patient to contact our office if constipation or diarrhea recurs or if he develops abdominal pain - Check CTAP if significant constipation or abdominal pain occurs - Surveillance colonoscopy due 05/2025, may defer secondary to age  GERD, stable - Continue Omeprazole 20 mg p.o. daily    CC:  Zakhary, Boshra G, MD

## 2023-08-15 ENCOUNTER — Ambulatory Visit: Payer: Self-pay | Admitting: Nurse Practitioner

## 2023-08-16 ENCOUNTER — Telehealth: Payer: Self-pay | Admitting: Oncology

## 2023-08-16 ENCOUNTER — Ambulatory Visit: Admitting: Oncology

## 2023-08-16 NOTE — Telephone Encounter (Signed)
 Called to confirm appts on 7/17

## 2023-08-26 ENCOUNTER — Inpatient Hospital Stay: Attending: Oncology | Admitting: Oncology

## 2023-08-26 ENCOUNTER — Other Ambulatory Visit

## 2023-08-26 VITALS — BP 130/68 | HR 76 | Temp 97.6°F | Resp 16 | Ht 65.25 in | Wt 147.1 lb

## 2023-08-26 DIAGNOSIS — C189 Malignant neoplasm of colon, unspecified: Secondary | ICD-10-CM

## 2023-08-26 DIAGNOSIS — Z85038 Personal history of other malignant neoplasm of large intestine: Secondary | ICD-10-CM | POA: Insufficient documentation

## 2023-08-26 NOTE — Progress Notes (Signed)
  [facility] OFFICE PROGRESS NOTE   Diagnosis: Colon cancer  INTERVAL HISTORY:   Todd French returns for a scheduled visit.  He had symptoms of an upper respiratory infection/allergies last month.  The symptoms have improved.  He was seen by gastroenterology 08/12/2023 to evaluate diarrhea and constipation.  Good appetite and energy level.  He is working on his property.  Objective:  Vital signs in last 24 hours:  Blood pressure 130/68, pulse 76, temperature 97.6 F (36.4 C), temperature source Oral, resp. rate 16, height 5' 5.25 (1.657 m), weight 147 lb 1.6 oz (66.7 kg), SpO2 100%.   Lymphatics: No cervical, supraclavicular, axillary, or inguinal nodes Resp: Decreased breath sounds with coarse rhonchi at the posterior base bilaterally, no respiratory distress Cardio: Regular rate and rhythm GI: No mass, nontender, no hepatosplenomegaly Vascular: No leg edema   Lab Results:  Lab Results  Component Value Date   WBC 11.4 (H) 08/12/2023   HGB 12.8 (L) 08/12/2023   HCT 38.6 (L) 08/12/2023   MCV 92.8 08/12/2023   PLT 219.0 08/12/2023   NEUTROABS 5.0 10/16/2022    CMP  Lab Results  Component Value Date   NA 138 09/16/2020   K 4.7 09/16/2020   CL 105 09/16/2020   CO2 25 09/16/2020   GLUCOSE 101 (H) 09/16/2020   BUN 18 09/16/2020   CREATININE 0.79 09/16/2020   CALCIUM 9.1 09/16/2020   PROT 6.6 09/16/2020   ALBUMIN  4.0 09/16/2020   AST 15 09/16/2020   ALT 15 09/16/2020   ALKPHOS 58 09/16/2020   BILITOT 0.5 09/16/2020   GFRNONAA >60 09/16/2020   GFRAA >60 01/17/2019    Lab Results  Component Value Date   CEA1 1.37 09/16/2020   CEA <1.00 07/26/2023    No results found for: INR, LABPROT  Imaging:  No results found.  Medications: I have reviewed the patient's current medications.   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, stage III (T3 N1), status post a laparoscopic right colectomy on 01/14/2016,  1/21 lymph nodes positive for metastatic carcinoma, lymphovascular and perineural invasion present MSI-high with loss of MLH1 and PMS2 expression No BRAF mutation MLH1 hypermethylation present Cycle 1 adjuvant Xeloda  02/13/2016 Cycle 2 adjuvant Xeloda  03/06/2016 Cycle 3 adjuvant Xeloda  03/27/2016 (placed on hold 04/03/2016 due to mucositis) Negative testing for DPD deficiency 05/04/2016 CTs 01/11/2017-negative for recurrent colon cancer Colonoscopy 02/24/2017- hyperplastic polyp removed from the transverse colon CTs 01/14/2018- no evidence of recurrent disease CTs 01/17/2019-no evidence of recurrent disease Colonoscopy 05/13/2020-no polyp   2.   History of colonic polyps   3.   Iron deficiency anemia-Resolved   4.   History of coronary artery disease   5.   Hand foot syndrome secondary to Xeloda -resolved   6.   Mucositis secondary to Xeloda -resolved   7.   Skin rash secondary to Xeloda -resolved   8.   Report of hospital admission in [place]  October 2023 with an esophageal food impaction      Disposition: Todd French was diagnosed with colon cancer in December 2017.  He remains in clinical remission.  He would like to continue follow-up in the medical oncology clinic.  He will return for an office visit in 1 year. He will follow-up with gastroenterology for irregular bowel habits and to consider the indication for colonoscopy surveillance.  Todd Hof, MD  08/26/2023  9:13 AM

## 2023-09-13 ENCOUNTER — Other Ambulatory Visit: Payer: Self-pay

## 2023-09-13 ENCOUNTER — Telehealth: Payer: Self-pay

## 2023-09-13 DIAGNOSIS — D649 Anemia, unspecified: Secondary | ICD-10-CM

## 2023-09-13 NOTE — Telephone Encounter (Signed)
-----   Message from [facility] M sent at 08/16/2023 11:01 AM EDT ----- See result note below. Contact patient or patient's wife and have them come in for repeat labs.   Todd Elida HERO, NP to Me  (Selected Message)    08/15/23  3:52 PM Result Note Todd French, refer to office viist 08/12/2023. Please contact patient and let him know his WBC count is a little elevated and his Hg level is a little low at 12.8. His thyroid  stimulating hormone level is normal. Please sent patient to our lab in 4 weeks for a CBC, IBC + ferritin, B12 and folate level. DX: anemia, unspecified. Patient to contact our office if he has recurrent constipation or abdominal pain.  THX

## 2023-09-13 NOTE — Telephone Encounter (Signed)
 Called and spoke with patient's wife and reminded her that it is time for Todd French to have repeat lab work. Informed her to have him come by our basement lab some time this week and he does not need an appt. Patient's wife verbalized understanding.

## 2023-09-15 ENCOUNTER — Other Ambulatory Visit

## 2023-09-15 ENCOUNTER — Ambulatory Visit: Payer: Self-pay | Admitting: Nurse Practitioner

## 2023-09-15 ENCOUNTER — Other Ambulatory Visit: Payer: Self-pay

## 2023-09-15 DIAGNOSIS — D649 Anemia, unspecified: Secondary | ICD-10-CM | POA: Diagnosis not present

## 2023-09-15 LAB — CBC WITH DIFFERENTIAL/PLATELET
Basophils Absolute: 0.1 K/uL (ref 0.0–0.1)
Basophils Relative: 0.9 % (ref 0.0–3.0)
Eosinophils Absolute: 0.3 K/uL (ref 0.0–0.7)
Eosinophils Relative: 3.1 % (ref 0.0–5.0)
HCT: 38 % — ABNORMAL LOW (ref 39.0–52.0)
Hemoglobin: 12.6 g/dL — ABNORMAL LOW (ref 13.0–17.0)
Lymphocytes Relative: 35.4 % (ref 12.0–46.0)
Lymphs Abs: 3 K/uL (ref 0.7–4.0)
MCHC: 33 g/dL (ref 30.0–36.0)
MCV: 94.2 fl (ref 78.0–100.0)
Monocytes Absolute: 0.7 K/uL (ref 0.1–1.0)
Monocytes Relative: 7.7 % (ref 3.0–12.0)
Neutro Abs: 4.5 K/uL (ref 1.4–7.7)
Neutrophils Relative %: 52.9 % (ref 43.0–77.0)
Platelets: 173 K/uL (ref 150.0–400.0)
RBC: 4.04 Mil/uL — ABNORMAL LOW (ref 4.22–5.81)
RDW: 16.5 % — ABNORMAL HIGH (ref 11.5–15.5)
WBC: 8.6 K/uL (ref 4.0–10.5)

## 2023-09-15 LAB — IBC + FERRITIN
Ferritin: 120.7 ng/mL (ref 22.0–322.0)
Iron: 84 ug/dL (ref 42–165)
Saturation Ratios: 28.7 % (ref 20.0–50.0)
TIBC: 292.6 ug/dL (ref 250.0–450.0)
Transferrin: 209 mg/dL — ABNORMAL LOW (ref 212.0–360.0)

## 2023-09-15 LAB — VITAMIN B12: Vitamin B-12: 129 pg/mL — ABNORMAL LOW (ref 211–911)

## 2023-09-15 LAB — FOLATE: Folate: >23.4 ng/mL (ref >5.9)

## 2023-09-16 ENCOUNTER — Telehealth: Payer: Self-pay | Admitting: *Deleted

## 2023-09-16 MED ORDER — VITAMIN B-12 1000 MCG PO TABS: 1000.0000 ug | ORAL_TABLET | Freq: Every day | ORAL | Status: AC

## 2023-09-16 NOTE — Telephone Encounter (Signed)
 Todd French left message reporting his B12 level is low according to recent labs and was told to call Dr. Cloretta for management. She is asking if he needs to be seen to start treatment? Message forwarded to MD.

## 2023-09-16 NOTE — Telephone Encounter (Signed)
 Per Dr. Cloretta, may start OTC vitamin b12 1000 mcg daily and f/u with PCP to monitor/manage this.  Recheck B-12 in 2-3 months with PCP. Routed labs and progress note to Dr. Ricka, PCP>

## 2023-09-17 ENCOUNTER — Telehealth: Payer: Self-pay

## 2023-09-17 NOTE — Telephone Encounter (Signed)
 The patient's spouse contacted us  to report an abnormal B12 level of 129. Elida Shawl, NP from [place], advised him to reach out to the oncology provider for further monitoring of the B12 level or to request a prescription for B12 supplementation.

## 2023-09-20 ENCOUNTER — Telehealth: Payer: Self-pay | Admitting: *Deleted

## 2023-09-20 NOTE — Progress Notes (Signed)
 Noted

## 2023-09-20 NOTE — Telephone Encounter (Addendum)
 Todd French left VM again asking for guidance on management of low B12.  Notified her that messages have been left for her. MD said take OTC B12 1000 mcg daily and recheck B12 in 2-3 months w/PCP.

## 2023-09-20 NOTE — Telephone Encounter (Signed)
 Inbound call from patient's wife requesting a call to speak further regarding lab work. Please advise, thank you.

## 2024-01-04 ENCOUNTER — Telehealth: Payer: Self-pay | Admitting: Nurse Practitioner

## 2024-08-25 ENCOUNTER — Ambulatory Visit: Admitting: Nurse Practitioner
